# Patient Record
Sex: Female | Born: 2000 | Race: Black or African American | Hispanic: No | Marital: Single | Smoking: Never smoker
Health system: Southern US, Community
[De-identification: ages and names within clinical notes are randomized; demographics above are authoritative.]

## PROBLEM LIST (undated history)

## (undated) DIAGNOSIS — F209 Schizophrenia, unspecified: Secondary | ICD-10-CM

## (undated) DIAGNOSIS — Z789 Other specified health status: Secondary | ICD-10-CM

## (undated) DIAGNOSIS — F32A Depression, unspecified: Secondary | ICD-10-CM

## (undated) DIAGNOSIS — F419 Anxiety disorder, unspecified: Secondary | ICD-10-CM

## (undated) DIAGNOSIS — D649 Anemia, unspecified: Secondary | ICD-10-CM

## (undated) HISTORY — PX: NO PAST SURGERIES: SHX2092

---
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
## 2021-05-15 ENCOUNTER — Telehealth: Payer: Self-pay

## 2021-05-15 ENCOUNTER — Other Ambulatory Visit: Payer: Self-pay

## 2021-05-15 ENCOUNTER — Encounter: Payer: Self-pay | Admitting: Obstetrics & Gynecology

## 2021-05-15 ENCOUNTER — Ambulatory Visit (INDEPENDENT_AMBULATORY_CARE_PROVIDER_SITE_OTHER): Payer: Medicaid Other | Admitting: Obstetrics & Gynecology

## 2021-05-15 VITALS — BP 106/70

## 2021-05-15 DIAGNOSIS — Z818 Family history of other mental and behavioral disorders: Secondary | ICD-10-CM

## 2021-05-15 DIAGNOSIS — Z01812 Encounter for preprocedural laboratory examination: Secondary | ICD-10-CM

## 2021-05-15 DIAGNOSIS — Z30015 Encounter for initial prescription of vaginal ring hormonal contraceptive: Secondary | ICD-10-CM

## 2021-05-15 DIAGNOSIS — Z3046 Encounter for surveillance of implantable subdermal contraceptive: Secondary | ICD-10-CM

## 2021-05-15 LAB — PREGNANCY, URINE: Preg Test, Ur: NEGATIVE

## 2021-05-15 MED ORDER — ETONOGESTREL-ETHINYL ESTRADIOL 0.12-0.015 MG/24HR VA RING: 1.0000 | VAGINAL_RING | VAGINAL | 4 refills | Status: DC

## 2021-05-15 NOTE — Progress Notes (Signed)
? ? ?Teresa Blackwell [birth year] [id number] ? ? ?     21 y.o.  G1P1L1 Single.  Son is 2+ yo. ? ?RP: Nexplanon removal and contraception counseling ? ?HPI: Nexplanon inserted in 11/2017, overdue for removal.  Last unprotected IC was 5 days ago.  No pelvic pain.  Mother recently Dxed with Bipolar disorder.  Patient feel she has lows and highs, would like to be evaluated.  Feeling good today.  No suicidal ideation. ? ? ? ?OB History  ?Gravida Para Term Preterm AB Living  ?1 0 0 0 0 1  ?SAB IAB Ectopic Multiple Live Births  ?0 0 0 0 0  ?  ?# Outcome Date GA Lbr Len/2nd Weight Sex Delivery Anes PTL Lv  ?1 Gravida           ? ? ?Past medical history,surgical history, problem list, medications, allergies, family history and social history were all reviewed and documented in the EPIC chart. ? ? ?Directed ROS with pertinent positives and negatives documented in the history of present illness/assessment and plan. ? ?Exam: ? ?Vitals:  ? 05/15/21 1146  ?BP: 106/70  ? ?General appearance:  Normal ? ?                                                           Nexplanon procedure note (removal) ? ?The patient presented to the office today requesting for removal of her Nexplanon that was placed in the year 9/.2019 on her left arm.   On examination the nexplanon implant was palpated and the distal end  (end  closest to the elbow) was marked. The area was sterilized with Betadine solution. 1% lidocaine was used for local anesthesia and approximately 1 cc  was injected into the site that was marked where the incision was to be made. The local anesthetic was injected under the implant in an effort to keep it  close to the skin surface. Slight pressure pushing downward was made at the proximal end  of the implant in an effort to stabilize it. A bulge appeared indicating the distal end of the implant. A small transverse incision of 2 mm was made at that location. By gently pushing the implant toward the incision, the tip became visible.  Grasping the implant with a curved forcep facilitated in gently removing the implant. Full confirmation of the entire implant which is 4 cm long was inspected and was intact and was shown to the patient and discarded. After removing the implant, the incision was closed with 3Steri-Strips, a band-aid and a bandage. Patient will be instructed to remove the pressure bandage in 24 hours, the band-aid in 3 days and the Steri-Strips in 7 days. ? ?UPT Neg ? ?                                            ?Assessment/Plan:  21 y.o. G1P0001  ? ?1. Encounter for Nexplanon removal ?Nexplanon inserted in 11/2017, overdue for removal.  Last unprotected IC was 5 days ago.  No pelvic pain.  Easy removal of Nexplanon.  No complication.  Post procedure precautions discussed. ? ?2. Encounter for initial prescription of vaginal ring hormonal contraceptive ?Counseling on contraception done.  Decides to use Nuvaring.  Usage, risks and benefits reviewed.  No CI.  Prescription sent to pharmacy.  Will do a HPT at 14 days from last unprotected IC before starting.  Use condoms during the first month.   ? ?3. Family history of bipolar disorder ?Mother recently Dxed with Bipolar disorder.  Patient concerned that she might have that disorder as well.  No suicidal ideation.  Well currently.  Refer to psychiatrist for evaluation and management. ? ?Counseling on above issues >20 minutes. ? ?Other orders ?- IBUPROFEN PO; Take by mouth. ?- Acetaminophen (TYLENOL PO); Take by mouth. ?- Acetaminophen (MIDOL PO); Take by mouth. ?- diphenhydrAMINE HCl (BENADRYL PO); Take by mouth. ?- etonogestrel-ethinyl estradiol (NUVARING) 0.12-0.015 MG/24HR vaginal ring; Place 1 each vaginally every 28 (twenty-eight) days. Insert vaginally and leave in place for 4 consecutive weeks, then switch rings.  ? ?Genia Del MD, 11:53 AM 05/15/2021 ? ? ? ?  ?

## 2021-05-15 NOTE — Addendum Note (Signed)
Addended by: Nelva Nay on: 05/15/2021 12:41 PM ? ? Modules accepted: Orders ? ?

## 2021-05-15 NOTE — Telephone Encounter (Signed)
Teresa Bruins, MD  [facility] Triage ?Mother recently diagnosed with Bipolar disorder.  Patient feels that she may have the disorder as well.  ? ? ?Referral sent to [facility].  ? ? ?

## 2021-06-03 NOTE — Addendum Note (Signed)
Addended by: Jodelle Red D on: 06/03/2021 04:46 PM ? ? Modules accepted: Orders ? ?

## 2021-06-03 NOTE — Telephone Encounter (Signed)
Followed up with [facility] in regards to referral. They stated that they did not accept medicaid.  ? ?Will fax referral to [facility]. ? ?They stated that they will call pt or pt can call them.  ? ? ?

## 2021-06-09 NOTE — Telephone Encounter (Signed)
Per [facility]- the number provided is not in service. ? ?I tried to call pt # and got the same msg "the call cannot be completed as dialed."  ?

## 2021-06-09 NOTE — Telephone Encounter (Signed)
Deanna Artis from [facility] states she will call pt today to set her up for an appt.  ?

## 2021-06-11 NOTE — Telephone Encounter (Signed)
FYI. Multiple attempt to contact pt re: referral to psych. Number in chart is not in service.  ?

## 2021-06-15 ENCOUNTER — Ambulatory Visit: Payer: Medicaid Other | Admitting: Obstetrics & Gynecology

## 2021-07-14 ENCOUNTER — Ambulatory Visit (INDEPENDENT_AMBULATORY_CARE_PROVIDER_SITE_OTHER): Payer: Medicaid Other | Admitting: Obstetrics & Gynecology

## 2021-07-14 ENCOUNTER — Encounter: Payer: Self-pay | Admitting: Obstetrics & Gynecology

## 2021-07-14 VITALS — BP 110/64 | HR 68 | Resp 16 | Ht 61.5 in | Wt 107.0 lb

## 2021-07-14 DIAGNOSIS — Z01419 Encounter for gynecological examination (general) (routine) without abnormal findings: Secondary | ICD-10-CM | POA: Diagnosis not present

## 2021-07-14 DIAGNOSIS — Z30011 Encounter for initial prescription of contraceptive pills: Secondary | ICD-10-CM | POA: Diagnosis not present

## 2021-07-14 MED ORDER — NORETHIN ACE-ETH ESTRAD-FE 1-20 MG-MCG(24) PO TABS: 1.0000 | ORAL_TABLET | Freq: Every day | ORAL | 4 refills | Status: DC

## 2021-07-14 NOTE — Progress Notes (Signed)
? ? ?Teresa Blackwell [birth year] [id number] ? ? ?History:    21 y.o.  G1P1L1 FOB involved.  SVD 08/2018.  Son Maureen Ralphs is almost 80 yo.  ?  ?RP:  Established patient presenting for annual gyn exam  ?  ?HPI: Nexplanon removed on 05/15/21 and started on Nuvaring.  Pelvic cramping on Nuvaring, so stopped it mid April. No BTB.  Using condoms since then. Would like to try BCPs.  No pelvic pain. No pain with IC. Urine/BMs normal.  Breasts normal.  BMI 19.89.   ? ? ?Past medical history,surgical history, family history and social history were all reviewed and documented in the EPIC chart. ? ?Gynecologic History ?Patient's last menstrual period was 06/23/2021 (exact date). ? ?Obstetric History ?OB History  ?Gravida Para Term Preterm AB Living  ?1 0 0 0 0 1  ?SAB IAB Ectopic Multiple Live Births  ?0 0 0 0 0  ?  ?# Outcome Date GA Lbr Len/2nd Weight Sex Delivery Anes PTL Lv  ?1 Gravida           ? ? ? ?ROS: A ROS was performed and pertinent positives and negatives are included in the history. ?GENERAL: No fevers or chills. HEENT: No change in vision, no earache, sore throat or sinus congestion. NECK: No pain or stiffness. CARDIOVASCULAR: No chest pain or pressure. No palpitations. PULMONARY: No shortness of breath, cough or wheeze. GASTROINTESTINAL: No abdominal pain, nausea, vomiting or diarrhea, melena or bright red blood per rectum. GENITOURINARY: No urinary frequency, urgency, hesitancy or dysuria. MUSCULOSKELETAL: No joint or muscle pain, no back pain, no recent trauma. DERMATOLOGIC: No rash, no itching, no lesions. ENDOCRINE: No polyuria, polydipsia, no heat or cold intolerance. No recent change in weight. HEMATOLOGICAL: No anemia or easy bruising or bleeding. NEUROLOGIC: No headache, seizures, numbness, tingling or weakness. PSYCHIATRIC: No depression, no loss of interest in normal activity or change in sleep pattern.  ?  ? ?Exam: ? ? ?BP 110/64   Pulse 68   Resp 16   Ht 5' 1.5" (1.562 m)   Wt 107 lb (48.5 kg)   LMP  06/23/2021 (Exact Date)   BMI 19.89 kg/m?  ? ?Body mass index is 19.89 kg/m?. ? ?General appearance : Well developed well nourished female. No acute distress ?HEENT: Eyes: no retinal hemorrhage or exudates,  Neck supple, trachea midline, no carotid bruits, no thyroidmegaly ?Lungs: Clear to auscultation, no rhonchi or wheezes, or rib retractions  ?Heart: Regular rate and rhythm, no murmurs or gallops ?Abdomen: no palpable masses or tenderness, no rebound or guarding ?Extremities: no edema or skin discoloration or tenderness ? ?Pelvic: Vulva: Normal ?            Vagina: No gross lesions or discharge ? Cervix: No gross lesions or discharge ? Uterus  AV, normal size, shape and consistency, non-tender and mobile ? Adnexa  Without masses or tenderness ? Anus: Normal ? ? ?Assessment/Plan:  21 y.o. female for annual exam  ? ?1. Well female exam with routine gynecological exam ? Nexplanon removed on 05/15/21 and started on Nuvaring.  Pelvic cramping on Nuvaring, so stopped it mid April. No BTB.  Using condoms since then. Would like to try BCPs.  No pelvic pain. No pain with IC. Urine/BMs normal.  Breasts normal.  BMI 19.89.   ? ?2. Encounter for initial prescription of contraceptive pills ?Side effect with pelvic discomfort on Nuvaring.  Will start on the generic of LoEstrin 24 FE 1/20 with next period.  Risks/benefits/usage thoroughly reviewed.  Prescription sent to pharmacy. ? ?Other orders ?- NAPROXEN SODIUM PO; Take by mouth. ?- Norethindrone Acetate-Ethinyl Estrad-FE (LOESTRIN 24 FE) 1-20 MG-MCG(24) tablet; Take 1 tablet by mouth daily.  ? ?Genia Del MD, 3:04 PM 07/14/2021 ? ?  ?

## 2021-11-18 LAB — HEPATITIS C ANTIBODY: HCV Ab: NEGATIVE

## 2021-11-24 LAB — OB RESULTS CONSOLE HEPATITIS B SURFACE ANTIGEN: Hepatitis B Surface Ag: NEGATIVE

## 2021-11-24 LAB — OB RESULTS CONSOLE HIV ANTIBODY (ROUTINE TESTING): HIV: NONREACTIVE

## 2021-11-27 LAB — OB RESULTS CONSOLE GC/CHLAMYDIA
Chlamydia: NEGATIVE
Neisseria Gonorrhea: NEGATIVE

## 2021-11-27 LAB — OB RESULTS CONSOLE RPR: RPR: NONREACTIVE

## 2021-11-27 LAB — OB RESULTS CONSOLE RUBELLA ANTIBODY, IGM: Rubella: IMMUNE

## 2021-12-04 ENCOUNTER — Encounter (HOSPITAL_COMMUNITY): Payer: Self-pay | Admitting: *Deleted

## 2021-12-04 ENCOUNTER — Inpatient Hospital Stay (HOSPITAL_COMMUNITY)
Admission: AD | Admit: 2021-12-04 | Discharge: 2021-12-05 | Disposition: A | Discharge status: Home / Self Care | Payer: Medicaid Other | Attending: Obstetrics and Gynecology | Admitting: Obstetrics and Gynecology

## 2021-12-04 DIAGNOSIS — O2340 Unspecified infection of urinary tract in pregnancy, unspecified trimester: Secondary | ICD-10-CM

## 2021-12-04 DIAGNOSIS — Z792 Long term (current) use of antibiotics: Secondary | ICD-10-CM | POA: Insufficient documentation

## 2021-12-04 DIAGNOSIS — O99712 Diseases of the skin and subcutaneous tissue complicating pregnancy, second trimester: Secondary | ICD-10-CM | POA: Insufficient documentation

## 2021-12-04 DIAGNOSIS — N390 Urinary tract infection, site not specified: Secondary | ICD-10-CM | POA: Insufficient documentation

## 2021-12-04 DIAGNOSIS — O26899 Other specified pregnancy related conditions, unspecified trimester: Secondary | ICD-10-CM

## 2021-12-04 DIAGNOSIS — L0591 Pilonidal cyst without abscess: Secondary | ICD-10-CM

## 2021-12-04 DIAGNOSIS — O2342 Unspecified infection of urinary tract in pregnancy, second trimester: Secondary | ICD-10-CM | POA: Insufficient documentation

## 2021-12-04 DIAGNOSIS — R109 Unspecified abdominal pain: Secondary | ICD-10-CM | POA: Insufficient documentation

## 2021-12-04 DIAGNOSIS — O26892 Other specified pregnancy related conditions, second trimester: Secondary | ICD-10-CM | POA: Insufficient documentation

## 2021-12-04 DIAGNOSIS — Z3A17 17 weeks gestation of pregnancy: Secondary | ICD-10-CM

## 2021-12-04 NOTE — MAU Note (Signed)
Pt says she has a boil Noticed at 0600 - On her lower back - upper buttocks  Also has cramps  lower abd and vag - having   cramps all day

## 2021-12-05 DIAGNOSIS — O2342 Unspecified infection of urinary tract in pregnancy, second trimester: Secondary | ICD-10-CM | POA: Diagnosis not present

## 2021-12-05 DIAGNOSIS — R109 Unspecified abdominal pain: Secondary | ICD-10-CM

## 2021-12-05 DIAGNOSIS — L0591 Pilonidal cyst without abscess: Secondary | ICD-10-CM | POA: Diagnosis not present

## 2021-12-05 DIAGNOSIS — N390 Urinary tract infection, site not specified: Secondary | ICD-10-CM | POA: Diagnosis not present

## 2021-12-05 DIAGNOSIS — Z792 Long term (current) use of antibiotics: Secondary | ICD-10-CM | POA: Diagnosis not present

## 2021-12-05 DIAGNOSIS — Z3A17 17 weeks gestation of pregnancy: Secondary | ICD-10-CM

## 2021-12-05 DIAGNOSIS — O99712 Diseases of the skin and subcutaneous tissue complicating pregnancy, second trimester: Secondary | ICD-10-CM | POA: Diagnosis not present

## 2021-12-05 DIAGNOSIS — O26892 Other specified pregnancy related conditions, second trimester: Secondary | ICD-10-CM | POA: Diagnosis not present

## 2021-12-05 LAB — URINALYSIS, ROUTINE W REFLEX MICROSCOPIC
Bilirubin Urine: NEGATIVE
Glucose, UA: NEGATIVE mg/dL
Hgb urine dipstick: NEGATIVE
Ketones, ur: NEGATIVE mg/dL
Nitrite: POSITIVE — AB
Protein, ur: NEGATIVE mg/dL
Specific Gravity, Urine: 1.023 (ref 1.005–1.030)
pH: 6 (ref 5.0–8.0)

## 2021-12-05 MED ORDER — CEFADROXIL 500 MG PO CAPS: 500.0000 mg | ORAL_CAPSULE | Freq: Two times a day (BID) | ORAL | 0 refills | Status: AC

## 2021-12-05 NOTE — MAU Provider Note (Signed)
Chief Complaint: Abdominal Pain   None     SUBJECTIVE HPI: Teresa Blackwell is a 21 y.o. G2P0001 at [redacted]w[redacted]d who presents to maternity admissions reporting dx of UTI in the office 2 days ago but her medicines have not been at the pharmacy. Today she started having sharp intermittent abdominal pain, like something  stabbing, and noticed a new cyst in her lower back/upper buttocks area.    HPI  Past Medical History:  Diagnosis Date   Medical history non-contributory    Past Surgical History:  Procedure Laterality Date   Nexplanon insertion  11/21/2017   NO PAST SURGERIES     Social History   Socioeconomic History   Marital status: Single    Spouse name: Not on file   Number of children: Not on file   Years of education: Not on file   Highest education level: Not on file  Occupational History   Not on file  Tobacco Use   Smoking status: Never   Smokeless tobacco: Current   Tobacco comments:    vape  Vaping Use   Vaping Use: Never used  Substance and Sexual Activity   Alcohol use: Not Currently   Drug use: No   Sexual activity: Yes    Partners: Male    Birth control/protection: Condom  Other Topics Concern   Not on file  Social History Narrative   Not on file   Social Determinants of Health   Financial Resource Strain: Not on file  Food Insecurity: Not on file  Transportation Needs: Not on file  Physical Activity: Not on file  Stress: Not on file  Social Connections: Not on file  Intimate Partner Violence: Not on file   No current facility-administered medications on file prior to encounter.   Current Outpatient Medications on File Prior to Encounter  Medication Sig Dispense Refill   Acetaminophen (TYLENOL PO) Take by mouth.     diphenhydrAMINE HCl (BENADRYL PO) Take by mouth.     No Known Allergies  ROS:  Review of Systems  Constitutional:  Negative for chills, fatigue and fever.  Respiratory:  Negative for shortness of breath.   Cardiovascular:   Negative for chest pain.  Gastrointestinal:  Positive for abdominal pain. Negative for nausea and vomiting.  Genitourinary:  Negative for difficulty urinating, dysuria, flank pain, pelvic pain, vaginal bleeding, vaginal discharge and vaginal pain.  Musculoskeletal:  Positive for back pain.  Skin:        Painful bump in buttocks/low back area  Neurological:  Negative for dizziness and headaches.  Psychiatric/Behavioral: Negative.       I have reviewed patient's Past Medical Hx, Surgical Hx, Family Hx, Social Hx, medications and allergies.   Physical Exam  Patient Vitals for the past 24 hrs:  BP Temp Pulse Resp Height Weight  12/05/21 0215 -- -- -- 20 -- --  12/04/21 2350 (!) 105/49 98 F (36.7 C) 82 20 5\' 2"  (1.575 m) 63.5 kg   Constitutional: Well-developed, well-nourished female in no acute distress.  Cardiovascular: normal rate Respiratory: normal effort GI: Abd soft, non-tender. Pos BS x 4 MS: Extremities nontender, no edema, normal ROM Neurologic: Alert and oriented x 4.  GU: Neg CVAT. Integumentary: No visible edema or erythema at natal cleft but palpable 1.5 cm smooth round mass with significant tenderness noted. No pain palpated 1-2 cm above or below mass.   PELVIC EXAM:  Dilation: Closed Effacement (%): Thick Exam by:: Fatima Blank, CNM   FHT 150 by doppler  LAB  RESULTS Results for orders placed or performed during the hospital encounter of 12/04/21 (from the past 24 hour(s))  Urinalysis, Routine w reflex microscopic Urine, Clean Catch     Status: Abnormal   Collection Time: 12/05/21 12:44 AM  Result Value Ref Range   Color, Urine YELLOW YELLOW   APPearance HAZY (A) CLEAR   Specific Gravity, Urine 1.023 1.005 - 1.030   pH 6.0 5.0 - 8.0   Glucose, UA NEGATIVE NEGATIVE mg/dL   Hgb urine dipstick NEGATIVE NEGATIVE   Bilirubin Urine NEGATIVE NEGATIVE   Ketones, ur NEGATIVE NEGATIVE mg/dL   Protein, ur NEGATIVE NEGATIVE mg/dL   Nitrite POSITIVE (A) NEGATIVE    Leukocytes,Ua SMALL (A) NEGATIVE   RBC / HPF 0-5 0 - 5 RBC/hpf   WBC, UA 6-10 0 - 5 WBC/hpf   Bacteria, UA MANY (A) NONE SEEN   Squamous Epithelial / LPF 0-5 0 - 5   Mucus PRESENT    Ca Oxalate Crys, UA PRESENT        IMAGING No results found.  MAU Management/MDM: Orders Placed This Encounter  Procedures   Culture, OB Urine   Urinalysis, Routine w reflex microscopic Urine, Clean Catch   Discharge patient    Meds ordered this encounter  Medications   cefadroxil (DURICEF) 500 MG capsule    Sig: Take 1 capsule (500 mg total) by mouth 2 (two) times daily for 7 days.    Dispense:  14 capsule    Refill:  0    Order Specific Question:   Supervising Provider    Answer:   Elonda Husky, LUTHER H [[id number]]    No evidence of preterm labor with closed cervix.  Pt with known UTI, unable to pick up medications and UA in MAU tonight confirms UTI with positive nitrites.  No systemic symptoms or CVA tenderness to indicate pyelonephritis.  Rx sent to 24 hour pharmacy for Duricef BID for 7 days. Pt to use warm compresses, ice for comfort, Tylenol for likely pilonidal cyst.  F/U in office as scheduled. Return to MAU as needed for emergencies.    ASSESSMENT 1. Urinary tract infection in mother during pregnancy, antepartum   2. [redacted] weeks gestation of pregnancy   3. Abdominal pain affecting pregnancy   4. Pilonidal cyst of natal cleft     PLAN Discharge home Allergies as of 12/05/2021   No Known Allergies      Medication List     STOP taking these medications    IBUPROFEN PO   NAPROXEN SODIUM PO   Norethindrone Acetate-Ethinyl Estrad-FE 1-20 MG-MCG(24) tablet Commonly known as: LOESTRIN 24 FE       TAKE these medications    BENADRYL PO Take by mouth.   cefadroxil 500 MG capsule Commonly known as: DURICEF Take 1 capsule (500 mg total) by mouth 2 (two) times daily for 7 days.   TYLENOL PO Take by mouth. What changed: Another medication with the same name was removed. Continue  taking this medication, and follow the directions you see here.        Follow-up [facility] Follow up.   Specialty: Obstetrics and Gynecology Why: As scheduled Contact information: [address] [place] [address] [phone]        [facility] Follow up.   Specialty: Obstetrics and Gynecology Why: As needed for emergencies Contact information: [address] [id number] [place] [phone]  Fatima Blank Certified Nurse-Midwife 12/05/2021  4:20 AM

## 2021-12-08 LAB — CULTURE, OB URINE: Culture: >=100000 — AB

## 2022-02-24 ENCOUNTER — Other Ambulatory Visit: Payer: Self-pay

## 2022-02-24 ENCOUNTER — Encounter (HOSPITAL_COMMUNITY): Payer: Self-pay | Admitting: Obstetrics & Gynecology

## 2022-02-24 ENCOUNTER — Inpatient Hospital Stay (HOSPITAL_COMMUNITY)
Admission: AD | Admit: 2022-02-24 | Discharge: 2022-02-24 | Disposition: A | Discharge status: Home / Self Care | Payer: Medicaid Other | Attending: Obstetrics & Gynecology | Admitting: Obstetrics & Gynecology

## 2022-02-24 DIAGNOSIS — R102 Pelvic and perineal pain: Secondary | ICD-10-CM | POA: Diagnosis present

## 2022-02-24 DIAGNOSIS — Z3A28 28 weeks gestation of pregnancy: Secondary | ICD-10-CM | POA: Diagnosis not present

## 2022-02-24 DIAGNOSIS — N949 Unspecified condition associated with female genital organs and menstrual cycle: Secondary | ICD-10-CM

## 2022-02-24 DIAGNOSIS — O26893 Other specified pregnancy related conditions, third trimester: Secondary | ICD-10-CM | POA: Diagnosis not present

## 2022-02-24 HISTORY — DX: Schizophrenia, unspecified: F20.9

## 2022-02-24 HISTORY — DX: Depression, unspecified: F32.A

## 2022-02-24 LAB — URINALYSIS, ROUTINE W REFLEX MICROSCOPIC
Bacteria, UA: NONE SEEN
Bilirubin Urine: NEGATIVE
Glucose, UA: NEGATIVE mg/dL
Hgb urine dipstick: NEGATIVE
Ketones, ur: 80 mg/dL — AB
Leukocytes,Ua: NEGATIVE
Nitrite: NEGATIVE
Protein, ur: 30 mg/dL — AB
Specific Gravity, Urine: 1.024 (ref 1.005–1.030)
pH: 6 (ref 5.0–8.0)

## 2022-02-24 MED ORDER — CYCLOBENZAPRINE HCL 5 MG PO TABS
10.0000 mg | ORAL_TABLET | Freq: Once | ORAL | Status: AC
  Administered 2022-02-24: 10 mg via ORAL
  Filled 2022-02-24: qty 2

## 2022-02-24 MED ORDER — ONDANSETRON 4 MG PO TBDP
8.0000 mg | ORAL_TABLET | Freq: Once | ORAL | Status: AC
  Administered 2022-02-24: 8 mg via ORAL
  Filled 2022-02-24: qty 2

## 2022-02-24 NOTE — MAU Provider Note (Signed)
History     CSN: [id number]  Arrival date and time: 02/24/22 1626   Event Date/Time   First Provider Initiated Contact with Patient 02/24/22 1733      Chief Complaint  Patient presents with   Pelvic Pain   HPI  Ms. Teresa Blackwell is a 21 y.o. year old G1P1001 female at [redacted]w[redacted]d weeks gestation who presents to MAU reporting sharp, lower abdomen pain that started today. She reports this pain has happened before, but always goes away. Today the pain didn't go away as usual. The pain is worsened with walking and "when I start to get stressed out." She states, "I have been very distracted and stressed today." She denies any VB or LOF. She is currently feeling nausea; "only ate breakfast this morning." She receives [facility] with [facility]; next appt is 02/25/2022.   OB History     Gravida  2   Para  1   Term  1   Preterm  0   AB  0   Living  1      SAB  0   IAB  0   Ectopic  0   Multiple  0   Live Births  1           Past Medical History:  Diagnosis Date   Depression    Medical history non-contributory    Schizophrenia (HCC)     Past Surgical History:  Procedure Laterality Date   Nexplanon insertion  11/21/2017   NO PAST SURGERIES      Family History  Problem Relation Age of Onset   Breast cancer Mother    Schizophrenia Father    Diabetes Maternal Grandmother    Hypertension Maternal Grandmother    Cancer Maternal Grandmother    HIV/AIDS Maternal Grandfather    Hypertension Paternal Grandfather     Social History   Tobacco Use   Smoking status: Never   Smokeless tobacco: Current   Tobacco comments:    vape  Vaping Use   Vaping Use: Never used  Substance Use Topics   Alcohol use: Not Currently   Drug use: No    Allergies: No Known Allergies  Medications Prior to Admission  Medication Sig Dispense Refill Last Dose   Acetaminophen (TYLENOL PO) Take by mouth.   02/23/2022   Prenatal Vit-Fe Fumarate-FA (PRENATAL MULTIVITAMIN)  TABS tablet Take 1 tablet by mouth daily at 12 noon.   02/24/2022   terconazole (TERAZOL 7) 0.4 % vaginal cream Place 1 applicator vaginally at bedtime.   02/23/2022   diphenhydrAMINE HCl (BENADRYL PO) Take by mouth.   More than a month    Review of Systems  Constitutional: Negative.   HENT: Negative.    Eyes: Negative.   Respiratory: Negative.    Cardiovascular: Negative.   Gastrointestinal: Negative.   Endocrine: Negative.   Genitourinary:  Positive for pelvic pain (increases with walking).  Musculoskeletal: Negative.   Skin: Negative.   Allergic/Immunologic: Negative.   Neurological: Negative.   Hematological: Negative.   Psychiatric/Behavioral: Negative.     Physical Exam   Blood pressure (!) 123/48, pulse 76, temperature 98.5 F (36.9 C), temperature source Oral, resp. rate 16, height 5\' 2"  (1.575 m), weight 63.3 kg, last menstrual period 06/23/2021, SpO2 99 %, currently breastfeeding.  Physical Exam Vitals and nursing note reviewed.  Constitutional:      Appearance: Normal appearance. She is normal weight.  Cardiovascular:     Rate and Rhythm: Normal rate.  Pulmonary:  Effort: Pulmonary effort is normal.  Abdominal:     Palpations: Abdomen is soft.  Genitourinary:    Comments: deferred Skin:    General: Skin is warm and dry.  Neurological:     Mental Status: She is alert and oriented to person, place, and time.  Psychiatric:        Mood and Affect: Mood normal.        Behavior: Behavior normal.        Thought Content: Thought content normal.        Judgment: Judgment normal.    REACTIVE NST - FHR: 130 bpm / moderate variability / accels present / decels absent / TOCO: UI noted, irreg UC's  MAU Course  Procedures  MDM CCUA Flexeril 10 mg po -- pain improving Zofran 8 mg ODT -- resolved N/V  Results for orders placed or performed during the hospital encounter of 02/24/22 (from the past 24 hour(s))  Urinalysis, Routine w reflex microscopic Urine,  Clean Catch     Status: Abnormal   Collection Time: 02/24/22  5:01 PM  Result Value Ref Range   Color, Urine YELLOW YELLOW   APPearance HAZY (A) CLEAR   Specific Gravity, Urine 1.024 1.005 - 1.030   pH 6.0 5.0 - 8.0   Glucose, UA NEGATIVE NEGATIVE mg/dL   Hgb urine dipstick NEGATIVE NEGATIVE   Bilirubin Urine NEGATIVE NEGATIVE   Ketones, ur 80 (A) NEGATIVE mg/dL   Protein, ur 30 (A) NEGATIVE mg/dL   Nitrite NEGATIVE NEGATIVE   Leukocytes,Ua NEGATIVE NEGATIVE   RBC / HPF 0-5 0 - 5 RBC/hpf   WBC, UA 0-5 0 - 5 WBC/hpf   Bacteria, UA NONE SEEN NONE SEEN   Squamous Epithelial / LPF 0-5 0 - 5   Mucus PRESENT     Assessment and Plan  1. Round ligament pain - Information provided on RLP - Advised to take Tylenol 1000 mg po prn pain   2. [redacted] weeks gestation of pregnancy   - Discharge home - Keep scheduled appt with [facility] 02/25/2022 - Patient verbalized an understanding of the plan of care and agrees.    Raelyn Mora, CNM 02/24/2022, 5:33 PM

## 2022-02-24 NOTE — MAU Note (Signed)
Teresa Blackwell is a 21 y.o. at [redacted]w[redacted]d here in MAU reporting: sharp lower abdominal pain that started today. Pt reports she has had this pain before and it has gone away, today it has not gone away. Pain is intermittent and occurs mostly with walking. No bleeding or LOF. Pt reports she is currently being treated for yeast infection through her OB office and she is seeing some discharge from the suppositories.   Pt also reporting hx of depression and schizophrenia. Pt not currently on meds. While asking safety questions pt does report that she has had suicidal ideation within the past month but denies plan or intention.   Onset of complaint: today  Pain score: 7/10  Vitals:   02/24/22 1713  BP: (!) 123/48  Pulse: 76  Resp: 16  Temp: 98.5 F (36.9 C)  SpO2: 99%     FHT:+FM, EFM applied in room  Lab orders placed from triage: UA

## 2022-03-08 NOTE — L&D Delivery Note (Signed)
NVD NOTE Date:    Delivering Physician:  Bing Matter DO Anesthesia:   1.  Laboring epidural  Pre-Delivery Diagnosis:   1.  22 y.o. G2P1001 [redacted]w[redacted]d 2.  SROM   Post-Delivery Diagnosis:   Same  Delivery of liveborn female neonate  Procedure:  Spontaneous vaginal delivery  QBL: 1123456mL  Complications:  None  LABOR AND DELIVERY SUMMARY The patient is a 89yoG2P1001 who presented at 40 0/[redacted] weeks gestation with SROM and clear fluid at 0200. She was admitted, placed on continuous EFM and toco.  Her labor course was augmented with pitocin. She received an epidural for pain control.   The patient progressed to completely effaced and dilated at 1403.  Patient labored down until she had an overwhelming urge to push and pushing began.  A liveborn female neonate was delivered via spontaneous vaginal delivery over an intact perineum from the LOA position at 1435.  There was no nuchal cord.  Spontaneous cry was noted.  No shoulder dystocia was encountered.  Infant was placed on the maternal abdomen immediately following delivery.  Oropharynx and nasopharynx were bulb suctioned immediately following delivery.  Cord was doubly clamped and cut after delayed cord clamping.  Cord blood obtained. Pitocin was added to the patient's IV. The placenta delivered spontaneously and apparently intact with a 3 vessel cord at 1440 and discarded.  Uterus was noted to be firm.  No lacerations were identified.  All sponge, needle and instrument counts were correct at the end of the procedure.  The patient and baby remained in the LDR in stable condition. Dr. OIvin Poot was present for the entire delivery.  Baby Name: DRhetta Mura   Delivery Summary for MFonnie Mu Labor Events:   Preterm labor: No data found  Rupture date: 05/15/2022  Rupture time: 2:45 AM  Rupture type: Spontaneous Intact  Fluid Color: Clear  Induction: No data found  Augmentation: No data found  Complications: No data found  Cervical  ripening: No data found No data found   No data found     Delivery:   Episiotomy: No data found  Lacerations: No data found  Repair suture: No data found  Repair # of packets: No data found  Blood loss (ml): 105   Information for the patient's newborn:  AEsm…, Flammer[[id number]  Delivery 05/15/2022 2:35 PM by   Sex:  female Gestational Age: 4641w0delivery Clinician:   Living?:         APGARS  One minute Five minutes Ten minutes  Skin color:        Heart rate:        Grimace:        Muscle tone:        Breathing:        Totals: 8  9      Presentation/position:      Resuscitation:   Cord information:    Disposition of cord blood:     Blood gases sent?  Complications:   Placenta: Delivered:       appearance Newborn Measurements: Weight:    Height:    Head circumference:    Chest circumference:    Other providers:    Additional  information: Forceps:   Vacuum:   Breech:   Observed anomalies        Dr. EbBing Matter

## 2022-04-02 ENCOUNTER — Inpatient Hospital Stay (HOSPITAL_COMMUNITY)
Admission: AD | Admit: 2022-04-02 | Discharge: 2022-04-02 | Disposition: A | Discharge status: Home / Self Care | Payer: Medicaid Other | Attending: Obstetrics & Gynecology | Admitting: Obstetrics & Gynecology

## 2022-04-02 ENCOUNTER — Encounter (HOSPITAL_COMMUNITY): Payer: Self-pay | Admitting: Obstetrics & Gynecology

## 2022-04-02 DIAGNOSIS — O99343 Other mental disorders complicating pregnancy, third trimester: Secondary | ICD-10-CM | POA: Diagnosis not present

## 2022-04-02 DIAGNOSIS — F209 Schizophrenia, unspecified: Secondary | ICD-10-CM | POA: Insufficient documentation

## 2022-04-02 DIAGNOSIS — O99333 Smoking (tobacco) complicating pregnancy, third trimester: Secondary | ICD-10-CM | POA: Insufficient documentation

## 2022-04-02 DIAGNOSIS — W19XXXA Unspecified fall, initial encounter: Secondary | ICD-10-CM | POA: Diagnosis not present

## 2022-04-02 DIAGNOSIS — Z3689 Encounter for other specified antenatal screening: Secondary | ICD-10-CM

## 2022-04-02 DIAGNOSIS — R102 Pelvic and perineal pain: Secondary | ICD-10-CM | POA: Insufficient documentation

## 2022-04-02 DIAGNOSIS — R109 Unspecified abdominal pain: Secondary | ICD-10-CM | POA: Insufficient documentation

## 2022-04-02 DIAGNOSIS — F17290 Nicotine dependence, other tobacco product, uncomplicated: Secondary | ICD-10-CM | POA: Insufficient documentation

## 2022-04-02 DIAGNOSIS — O26893 Other specified pregnancy related conditions, third trimester: Secondary | ICD-10-CM | POA: Insufficient documentation

## 2022-04-02 DIAGNOSIS — Z3A33 33 weeks gestation of pregnancy: Secondary | ICD-10-CM | POA: Insufficient documentation

## 2022-04-02 DIAGNOSIS — M79604 Pain in right leg: Secondary | ICD-10-CM | POA: Diagnosis not present

## 2022-04-02 HISTORY — DX: Anemia, unspecified: D64.9

## 2022-04-02 HISTORY — DX: Anxiety disorder, unspecified: F41.9

## 2022-04-02 LAB — URINALYSIS, ROUTINE W REFLEX MICROSCOPIC
Bilirubin Urine: NEGATIVE
Glucose, UA: NEGATIVE mg/dL
Hgb urine dipstick: NEGATIVE
Ketones, ur: 80 mg/dL — AB
Nitrite: NEGATIVE
Protein, ur: 30 mg/dL — AB
Specific Gravity, Urine: 1.019 (ref 1.005–1.030)
pH: 7 (ref 5.0–8.0)

## 2022-04-02 MED ORDER — ACETAMINOPHEN 500 MG PO TABS
1000.0000 mg | ORAL_TABLET | Freq: Once | ORAL | Status: AC
  Administered 2022-04-02: 1000 mg via ORAL
  Filled 2022-04-02: qty 2

## 2022-04-02 MED ORDER — CYCLOBENZAPRINE HCL 5 MG PO TABS
10.0000 mg | ORAL_TABLET | Freq: Once | ORAL | Status: AC
  Administered 2022-04-02: 10 mg via ORAL
  Filled 2022-04-02: qty 2

## 2022-04-02 MED ORDER — LACTATED RINGERS IV BOLUS
1000.0000 mL | Freq: Once | INTRAVENOUS | Status: AC
  Administered 2022-04-02: 1000 mL via INTRAVENOUS

## 2022-04-02 MED ORDER — CYCLOBENZAPRINE HCL 10 MG PO TABS: 10.0000 mg | ORAL_TABLET | Freq: Two times a day (BID) | ORAL | 0 refills | Status: DC | PRN

## 2022-04-02 NOTE — MAU Provider Note (Signed)
History     CSN: [id number]  Arrival date and time: 04/02/22 1609   None     Chief Complaint  Patient presents with   Fall   Pelvic Pain   HPI Teresa Blackwell is a 22 y.o. G2P1001 at [redacted]w[redacted]d who presents to MAU via EMS after a fall. She reports that around 1540 today she slipped on something in the kitchen and did a full split. She is unsure of what she slipped on. She then reports she lowered herself onto her back. She reports she did not hit her abdomen or her head. Since the incident she reports a lot of pain in her groin, especially on the right side and into her right leg. She is also reporting a lot of abdominal cramping, however this has been an ongoing occurrence for the last 3 weeks. She reports her doctor "put me on bedrest because I'm not resting enough". Pain usually occurs after walking or standing for long periods of time, however the pain is more constant today. She reports pain is currently a 7/10, but feels better when her legs are crossed.   She reports that she receives [facility] and this has been an uncomplicated pregnancy.  OB History     Gravida  2   Para  1   Term  1   Preterm  0   AB  0   Living  1      SAB  0   IAB  0   Ectopic  0   Multiple  0   Live Births  1           Past Medical History:  Diagnosis Date   Anemia    Anxiety    Depression    Medical history non-contributory    Schizophrenia (HCC)     Past Surgical History:  Procedure Laterality Date   Nexplanon insertion  11/21/2017   NO PAST SURGERIES      Family History  Problem Relation Age of Onset   Breast cancer Mother    Schizophrenia Father    Diabetes Maternal Grandmother    Hypertension Maternal Grandmother    Cancer Maternal Grandmother    HIV/AIDS Maternal Grandfather    Hypertension Paternal Grandfather     Social History   Tobacco Use   Smoking status: Never   Smokeless tobacco: Current   Tobacco comments:    vape  Vaping Use   Vaping Use:  Never used  Substance Use Topics   Alcohol use: Not Currently   Drug use: No    Allergies: No Known Allergies  Medications Prior to Admission  Medication Sig Dispense Refill Last Dose   Acetaminophen (TYLENOL PO) Take by mouth.   03/29/2022   ferrous sulfate 325 (65 FE) MG EC tablet Take 325 mg by mouth 3 (three) times daily with meals.   04/02/2022   Prenatal Vit-Fe Fumarate-FA (PRENATAL MULTIVITAMIN) TABS tablet Take 1 tablet by mouth daily at 12 noon.   04/02/2022   diphenhydrAMINE HCl (BENADRYL PO) Take by mouth.   Unknown   terconazole (TERAZOL 7) 0.4 % vaginal cream Place 1 applicator vaginally at bedtime.      Review of Systems  Constitutional: Negative.   Gastrointestinal:  Positive for abdominal pain (cramping).  Musculoskeletal:        Groin pain  All other systems reviewed and are negative.  Physical Exam  Patient Vitals for the past 24 hrs:  BP Temp Temp src Pulse Resp SpO2  04/02/22  2110 (!) 113/49 -- -- 73 -- 99 %  04/02/22 2105 -- -- -- -- -- 99 %  04/02/22 2100 -- -- -- -- -- 98 %  04/02/22 2055 -- -- -- -- -- 98 %  04/02/22 2050 -- -- -- -- -- 99 %  04/02/22 2045 -- -- -- -- -- 98 %  04/02/22 2040 -- -- -- -- -- 98 %  04/02/22 2035 -- -- -- -- -- 98 %  04/02/22 2030 -- -- -- -- -- 98 %  04/02/22 2025 -- -- -- -- -- 98 %  04/02/22 2020 -- -- -- -- -- 98 %  04/02/22 2015 -- -- -- -- -- 97 %  04/02/22 2010 -- -- -- -- -- 97 %  04/02/22 2005 -- -- -- -- -- 98 %  04/02/22 2000 -- -- -- -- -- 98 %  04/02/22 1955 -- -- -- -- -- 98 %  04/02/22 1954 -- -- -- -- -- 98 %  04/02/22 1950 -- -- -- -- -- 98 %  04/02/22 1945 -- -- -- -- -- 98 %  04/02/22 1940 -- -- -- -- -- 99 %  04/02/22 1935 -- -- -- -- -- 99 %  04/02/22 1930 -- -- -- -- -- 99 %  04/02/22 1925 -- -- -- -- -- 98 %  04/02/22 1920 -- -- -- -- -- 99 %  04/02/22 1915 -- -- -- -- -- 99 %  04/02/22 1910 -- -- -- -- -- 100 %  04/02/22 1905 -- -- -- -- -- 100 %  04/02/22 1900 -- -- -- -- -- 100 %   04/02/22 1855 -- -- -- -- -- 100 %  04/02/22 1850 -- -- -- -- -- 100 %  04/02/22 1845 -- -- -- -- -- 100 %  04/02/22 1840 -- -- -- -- -- 100 %  04/02/22 1835 -- -- -- -- -- 100 %  04/02/22 1830 -- -- -- -- -- 100 %  04/02/22 1825 -- -- -- -- -- 100 %  04/02/22 1820 -- -- -- -- -- 100 %  04/02/22 1805 -- -- -- -- -- 100 %  04/02/22 1800 -- -- -- -- -- 100 %  04/02/22 1750 -- -- -- -- -- 100 %  04/02/22 1745 -- -- -- -- -- 100 %  04/02/22 1740 -- -- -- -- -- 100 %  04/02/22 1735 -- -- -- -- -- 100 %  04/02/22 1730 -- -- -- -- -- 100 %  04/02/22 1725 -- -- -- -- -- 100 %  04/02/22 1720 -- -- -- -- -- 100 %  04/02/22 1715 -- -- -- -- -- 99 %  04/02/22 1710 -- -- -- -- -- 98 %  04/02/22 1705 -- -- -- -- -- 100 %  04/02/22 1700 -- -- -- -- -- 100 %  04/02/22 1655 -- -- -- -- -- 100 %  04/02/22 1640 -- -- -- -- -- 100 %  04/02/22 1635 -- -- -- -- -- 99 %  04/02/22 1630 -- -- -- -- -- 99 %  04/02/22 1627 -- 98.2 F (36.8 C) Oral -- -- --  04/02/22 1625 -- -- -- -- -- 99 %  04/02/22 1620 -- -- -- -- -- 100 %  04/02/22 1617 (!) 114/58 -- -- 74 16 100 %   Physical Exam Vitals and nursing note reviewed. Exam conducted with a chaperone present.  Constitutional:      General: She is not in  acute distress. Eyes:     Extraocular Movements: Extraocular movements intact.     Pupils: Pupils are equal, round, and reactive to light.  Cardiovascular:     Rate and Rhythm: Normal rate.  Pulmonary:     Effort: Pulmonary effort is normal.  Abdominal:     Palpations: Abdomen is soft.     Tenderness: There is no abdominal tenderness.     Comments: Gravid   Musculoskeletal:        General: Normal range of motion.     Cervical back: Normal range of motion.  Skin:    General: Skin is warm and dry.  Neurological:     General: No focal deficit present.     Mental Status: She is alert and oriented to person, place, and time.  Psychiatric:        Mood and Affect: Mood normal.        Behavior:  Behavior normal.   Dilation: Closed Cervical Position: Posterior Station: Ballotable Exam by:: Khristin Keleher,CNM  Results for orders placed or performed during the hospital encounter of 04/02/22 (from the past 24 hour(s))  Urinalysis, Routine w reflex microscopic -Urine, Clean Catch     Status: Abnormal   Collection Time: 04/02/22  4:50 PM  Result Value Ref Range   Color, Urine YELLOW YELLOW   APPearance HAZY (A) CLEAR   Specific Gravity, Urine 1.019 1.005 - 1.030   pH 7.0 5.0 - 8.0   Glucose, UA NEGATIVE NEGATIVE mg/dL   Hgb urine dipstick NEGATIVE NEGATIVE   Bilirubin Urine NEGATIVE NEGATIVE   Ketones, ur 80 (A) NEGATIVE mg/dL   Protein, ur 30 (A) NEGATIVE mg/dL   Nitrite NEGATIVE NEGATIVE   Leukocytes,Ua SMALL (A) NEGATIVE   RBC / HPF 0-5 0 - 5 RBC/hpf   WBC, UA 0-5 0 - 5 WBC/hpf   Bacteria, UA RARE (A) NONE SEEN   Squamous Epithelial / HPF 6-10 0 - 5 /HPF   Mucus PRESENT    NST FHR: 125 bpm, moderate variability, +15x15 accels, no decels Toco: Q 2-57mins  MAU Course  Procedures  MDM UA LR bolus Tylenol, Flexeril Extended fetal monitoring  UA with >80 ketones, leukocytes and bacteria. Will order urine culture. Patient asymptomatic. Patient noted to be contracting every 2-5 minutes. Cervix closed/thick/ballotable. LR bolus given. Patient was also given Tylenol and Flexeril due to groin pain. Given that patient did fall, will monitor patient for 4 hours.  On reassessment, patient reports pain has completely resolved, unless she stands up or moves into a certain position and then she feels groin pain. She reports contractions have resolved. Toco shows sporadic contractions. NST remains reactive and reassuring.   Reviewed patient may use Tylenol for aches/pains. Will send Flexeril to pharmacy. Encouraged to PO hydrate at home. Strict return precautions given. Return to MAU as needed. Patient has close follow up at [facility] next week.   Assessment and Plan  [redacted] weeks  gestation of pregnancy S/p fall Reactive NST  - Discharge home in stable condition - Flexeril to pharmacy - Strict return precautions. Return to MAU as needed for new/worsening symptoms - Keep OB appointment as scheduled next week  Renee Harder, CNM 04/02/2022, 9:14 PM

## 2022-04-02 NOTE — MAU Note (Signed)
.  Teresa Blackwell is a 22 y.o. at [redacted]w[redacted]d here in MAU reporting: fall approx 50 minutes ago. States she went down in a split, did not hit her abd but did hit her lower on the door. Reports positive fetal movement since fall , denies bleeding.  Onset of complaint: 1540 Pain score: 7/10 Vitals:   04/02/22 1617  BP: (!) 114/58  Pulse: 74  Resp: 16  SpO2: 100%     FHT:150 Lab orders placed from triage:

## 2022-04-04 LAB — CULTURE, OB URINE: Culture: <10000 — AB

## 2022-04-26 LAB — OB RESULTS CONSOLE GBS: GBS: NEGATIVE

## 2022-04-28 ENCOUNTER — Inpatient Hospital Stay (HOSPITAL_COMMUNITY)
Admission: AD | Admit: 2022-04-28 | Discharge: 2022-04-28 | Disposition: A | Discharge status: Home / Self Care | Payer: Medicaid Other | Attending: Obstetrics and Gynecology | Admitting: Obstetrics and Gynecology

## 2022-04-28 ENCOUNTER — Encounter (HOSPITAL_COMMUNITY): Payer: Self-pay | Admitting: Obstetrics and Gynecology

## 2022-04-28 DIAGNOSIS — R12 Heartburn: Secondary | ICD-10-CM | POA: Insufficient documentation

## 2022-04-28 DIAGNOSIS — R102 Pelvic and perineal pain: Secondary | ICD-10-CM | POA: Insufficient documentation

## 2022-04-28 DIAGNOSIS — O26893 Other specified pregnancy related conditions, third trimester: Secondary | ICD-10-CM | POA: Insufficient documentation

## 2022-04-28 DIAGNOSIS — R112 Nausea with vomiting, unspecified: Secondary | ICD-10-CM

## 2022-04-28 DIAGNOSIS — O212 Late vomiting of pregnancy: Secondary | ICD-10-CM | POA: Insufficient documentation

## 2022-04-28 DIAGNOSIS — Z3A37 37 weeks gestation of pregnancy: Secondary | ICD-10-CM | POA: Diagnosis not present

## 2022-04-28 DIAGNOSIS — R1084 Generalized abdominal pain: Secondary | ICD-10-CM

## 2022-04-28 DIAGNOSIS — R519 Headache, unspecified: Secondary | ICD-10-CM

## 2022-04-28 LAB — WET PREP, GENITAL
Clue Cells Wet Prep HPF POC: NONE SEEN
Sperm: NONE SEEN
Trich, Wet Prep: NONE SEEN
WBC, Wet Prep HPF POC: >=10 — AB (ref <10)
Yeast Wet Prep HPF POC: NONE SEEN

## 2022-04-28 LAB — URINALYSIS, ROUTINE W REFLEX MICROSCOPIC
Bacteria, UA: NONE SEEN
Bilirubin Urine: NEGATIVE
Glucose, UA: NEGATIVE mg/dL
Hgb urine dipstick: NEGATIVE
Ketones, ur: NEGATIVE mg/dL
Nitrite: NEGATIVE
Protein, ur: NEGATIVE mg/dL
Specific Gravity, Urine: 1.009 (ref 1.005–1.030)
pH: 9 — ABNORMAL HIGH (ref 5.0–8.0)

## 2022-04-28 MED ORDER — FAMOTIDINE 20 MG PO TABS: 10.0000 mg | ORAL_TABLET | Freq: Once | ORAL | Status: DC

## 2022-04-28 MED ORDER — ACETAMINOPHEN-CAFFEINE 500-65 MG PO TABS
1.0000 | ORAL_TABLET | Freq: Once | ORAL | Status: AC
  Administered 2022-04-28: 1 via ORAL
  Filled 2022-04-28: qty 1

## 2022-04-28 MED ORDER — ONDANSETRON 4 MG PO TBDP
4.0000 mg | ORAL_TABLET | Freq: Once | ORAL | Status: AC
  Administered 2022-04-28: 4 mg via ORAL
  Filled 2022-04-28: qty 1

## 2022-04-28 MED ORDER — ALUM & MAG HYDROXIDE-SIMETH 200-200-20 MG/5ML PO SUSP
30.0000 mL | Freq: Once | ORAL | Status: AC
  Administered 2022-04-28: 30 mL via ORAL
  Filled 2022-04-28: qty 30

## 2022-04-28 MED ORDER — CYCLOBENZAPRINE HCL 5 MG PO TABS
10.0000 mg | ORAL_TABLET | Freq: Once | ORAL | Status: AC
  Administered 2022-04-28: 10 mg via ORAL
  Filled 2022-04-28: qty 2

## 2022-04-28 MED ORDER — LIDOCAINE VISCOUS HCL 2 % MT SOLN
15.0000 mL | Freq: Once | OROMUCOSAL | Status: AC
  Administered 2022-04-28: 15 mL via ORAL
  Filled 2022-04-28: qty 15

## 2022-04-28 MED ORDER — FAMOTIDINE 20 MG PO TABS
20.0000 mg | ORAL_TABLET | Freq: Once | ORAL | Status: AC
  Administered 2022-04-28: 20 mg via ORAL
  Filled 2022-04-28: qty 1

## 2022-04-28 NOTE — MAU Note (Addendum)
...  Teresa Blackwell is a 22 y.o. at 77w4dhere in MAU reporting: Intermittent lower abdominal pain that wraps up to the top of her abdomen for the past three days. She reports she has been so uncomfortable over the past three days. She reports when she woke up this morning she felt as if she could not breathe. She reports she was lying directly on her back and when she sat up she felt dizzy. She reports she had two episodes of diarrhea yesterday and has been urinating every 10 minutes and does not feel as if she is emptying her bladder. Denies VB or LOF. +FM.  Closed last week in office.  Patient breathing short shallow breaths in triage. Patient states she is unsure if her breathing is due to her pain. RR 22. She reports she is feeling anxious as she did not experience this with her first pregnancy.  Onset of complaint: x3 days  Pain score:  10/10 lower abdomen 9/10 lower back   FHT: 135 doppler Lab orders placed from triage:  MAU Labor Eval

## 2022-04-28 NOTE — MAU Provider Note (Signed)
History     CSN: [id number]  Arrival date and time: 04/28/22 1305   Event Date/Time   First Provider Initiated Contact with Patient 04/28/22 1427      Chief Complaint  Patient presents with   Abdominal Pain   Back Pain   Contractions   Teresa Blackwell , a  22 y.o. G2P1001 at 53w4dpresents to MAU with complaints of abdominal pain, urinary symptoms and feeling lightheaded and dizzy. Patient states this began last night after laying down. She states she was laying on her back and then sat up and began to feel lightheaded and dizzy, then became short of breath. She states when she stands up initially is the worse.  She states she is also feeling upper abdominal pain that comes and goes and is worsened by fetal movement. She states when this pain occurs she gets a shooting pain down in her pelvis.   Patient states she is also having urinary frequency. She states she goes to the bathroom and feel like she does not completely empty her bladder and will have to go again within 10 mins. She denies urine having an odor or color but states she feels pressure with urination. She denies vaginal bleeding and abnormal vaginal discharge. She endorses positive fetal movement. She states she is also very nauseated with standing and states she placed a scop patch on yesterday that has helped keep the nausea at bTolono         OB History     Gravida  2   Para  1   Term  1   Preterm  0   AB  0   Living  1      SAB  0   IAB  0   Ectopic  0   Multiple  0   Live Births  1           Past Medical History:  Diagnosis Date   Anemia    Anxiety    Depression    Medical history non-contributory    [place]ia [place]     Past Surgical History:  Procedure Laterality Date   Nexplanon insertion  11/21/2017   NO PAST SURGERIES      Family History  Problem Relation Age of Onset   Breast cancer Mother    Schizophrenia Father    Diabetes Maternal Grandmother    Hypertension  Maternal Grandmother    Cancer Maternal Grandmother    HIV/AIDS Maternal Grandfather    Hypertension Paternal Grandfather     Social History   Tobacco Use   Smoking status: Never   Smokeless tobacco: Current   Tobacco comments:    vape  Vaping Use   Vaping Use: Never used  Substance Use Topics   Alcohol use: Not Currently   Drug use: No    Allergies: No Known Allergies  Medications Prior to Admission  Medication Sig Dispense Refill Last Dose   Acetaminophen (TYLENOL PO) Take by mouth.   Past Month   cyclobenzaprine (FLEXERIL) 10 MG tablet Take 1 tablet (10 mg total) by mouth 2 (two) times daily as needed for muscle spasms. 20 tablet 0 Past Month   ferrous sulfate 325 (65 FE) MG EC tablet Take 325 mg by mouth 3 (three) times daily with meals.   Past Week   Prenatal Vit-Fe Fumarate-FA (PRENATAL MULTIVITAMIN) TABS tablet Take 1 tablet by mouth daily at 12 noon.   04/27/2022   diphenhydrAMINE HCl (BENADRYL PO) Take by mouth.  terconazole (TERAZOL 7) 0.4 % vaginal cream Place 1 applicator vaginally at bedtime.       Review of Systems  Constitutional:  Negative for chills, fatigue and fever.  Eyes:  Negative for pain and visual disturbance.  Respiratory:  Positive for shortness of breath. Negative for apnea and wheezing.   Cardiovascular:  Negative for chest pain and palpitations.  Gastrointestinal:  Positive for diarrhea. Negative for abdominal pain, constipation, nausea and vomiting.  Genitourinary:  Positive for frequency, pelvic pain and urgency. Negative for difficulty urinating, dysuria, vaginal bleeding, vaginal discharge and vaginal pain.  Musculoskeletal:  Negative for back pain.  Neurological:  Positive for headaches. Negative for seizures and weakness.  Psychiatric/Behavioral:  Negative for suicidal ideas.    Physical Exam   Blood pressure (!) 118/56, pulse 82, temperature 98 F (36.7 C), temperature source Oral, resp. rate (!) 22, height 5' 1"$  (1.549 m), weight  64.3 kg, last menstrual period 06/23/2021, SpO2 100 %, currently breastfeeding.  Physical Exam Vitals and nursing note reviewed. Exam conducted with a chaperone present.  Constitutional:      General: She is not in acute distress.    Appearance: Normal appearance.  HENT:     Head: Normocephalic.  Pulmonary:     Effort: Pulmonary effort is normal.  Abdominal:     Palpations: Abdomen is soft.     Tenderness: There is no abdominal tenderness.  Genitourinary:    Vagina: Tenderness present.  Musculoskeletal:     Cervical back: Normal range of motion.  Skin:    General: Skin is warm and dry.  Neurological:     Mental Status: She is alert and oriented to person, place, and time.  Psychiatric:        Mood and Affect: Mood normal.    FHT: 135bpm with moderate variability, 15x15 accels no decels  Toco: occasional (patient unaware)  Dilation: Closed Exam by:: shay Jagjit Riner cnm  MAU Course  Procedures Orders Placed This Encounter  Procedures   Wet prep, genital   Urinalysis, Routine w reflex microscopic -Urine, Clean Catch   Discharge patient   Meds ordered this encounter  Medications   acetaminophen-caffeine (EXCEDRIN TENSION HEADACHE) 500-65 MG per tablet 1 tablet   cyclobenzaprine (FLEXERIL) tablet 10 mg   ondansetron (ZOFRAN-ODT) disintegrating tablet 4 mg   AND Linked Order Group    alum & mag hydroxide-simeth (MAALOX/MYLANTA) [id number] MG/5ML suspension 30 mL    lidocaine (XYLOCAINE) 2 % viscous mouth solution 15 mL   DISCONTD: famotidine (PEPCID) tablet 10 mg   famotidine (PEPCID) tablet 20 mg    MDM - ph 9.0 with Trace leuks and otherwise normal  - Wet prep positive for WBC otherwise normal . - GC pending upon discharge.  - Patient winces at pain with fetal movement.  - Her headache in now a 0/0 following PO Excedrin.  - Still complains of nausea and vomiting despite scop patch. ODT Zofran ordered. Counseled patient that the "feeling: of nausea may not go away, but  as long as she is keeping foods down, medication is working.  - Reassessment at 1630- Patient reports heartburn and upper left abdominal pain.  - GI cocktail and Pepcid ordered.  - Cervix closed; low suspicion for preterm labor.  - Plan for discharge   Assessment and Plan   1. Vaginal pain   2. Pregnancy headache in third trimester   3. Generalized abdominal pain   4. Nausea and vomiting, unspecified vomiting type   5. Heartburn during pregnancy in third  trimester   6. [redacted] weeks gestation of pregnancy    - Reviewed that abdominal discomfort can be very normal at this stage of pregnancy.  - Discussed comfort measures and things to do at home to relieve symptoms.  - Also reviewed heart burn and indigestion can be normal discomforts of pregnancy.  - Worsening signs and return precautions reviewed.  - FHT appropriate for gestational age at time of discharge.  - Term labor precautions reviewed.  - Patient discharged home in stable condition and may return to MAU as needed.   Jacquiline Doe, MSN CNM  04/28/2022, 4:54 PM

## 2022-04-29 LAB — GC/CHLAMYDIA PROBE AMP (~~LOC~~) NOT AT ARMC
Chlamydia: NEGATIVE
Comment: NEGATIVE
Comment: NORMAL
Neisseria Gonorrhea: NEGATIVE

## 2022-05-10 ENCOUNTER — Inpatient Hospital Stay (HOSPITAL_COMMUNITY)
Admission: AD | Admit: 2022-05-10 | Discharge: 2022-05-10 | Disposition: A | Discharge status: Home / Self Care | Payer: Medicaid Other | Attending: Obstetrics and Gynecology | Admitting: Obstetrics and Gynecology

## 2022-05-10 ENCOUNTER — Encounter (HOSPITAL_COMMUNITY): Payer: Self-pay | Admitting: Obstetrics and Gynecology

## 2022-05-10 DIAGNOSIS — O471 False labor at or after 37 completed weeks of gestation: Secondary | ICD-10-CM | POA: Diagnosis present

## 2022-05-10 DIAGNOSIS — Z3A39 39 weeks gestation of pregnancy: Secondary | ICD-10-CM | POA: Insufficient documentation

## 2022-05-10 MED ORDER — CYCLOBENZAPRINE HCL 5 MG PO TABS: 10.0000 mg | ORAL_TABLET | Freq: Once | ORAL | Status: DC

## 2022-05-10 NOTE — Discharge Instructions (Addendum)
Safe Medications in Pregnancy   Acne: Benzoyl Peroxide Salicylic Acid  Backache/Headache: Tylenol: 2 regular strength every 4 hours OR              2 Extra strength every 6 hours  Colds/Coughs/Allergies: Benadryl (alcohol free) 25 mg every 6 hours as needed Breath right strips Claritin Cepacol throat lozenges Chloraseptic throat spray Cold-Eeze- up to three times per day Cough drops, alcohol free Flonase (by prescription only) Guaifenesin Mucinex Robitussin DM (plain only, alcohol free) Saline nasal spray/drops Sudafed (pseudoephedrine) & Actifed ** use only after [redacted] weeks gestation and if you do not have high blood pressure Tylenol Vicks Vaporub Zinc lozenges Zyrtec   Constipation: Colace Ducolax suppositories Fleet enema Glycerin suppositories Metamucil Milk of magnesia Miralax Senokot Smooth move tea  Diarrhea: Kaopectate Imodium A-D  *NO pepto Bismol  Hemorrhoids: Anusol Anusol HC Preparation H Tucks  Indigestion: Tums Maalox Mylanta Zantac  Pepcid  Insomnia: Benadryl (alcohol free) '25mg'$  every 6 hours as needed Tylenol PM Unisom, no Gelcaps  Leg Cramps: Tums MagGel  Nausea/Vomiting:  Bonine Dramamine Emetrol Ginger extract Sea bands Meclizine  Nausea medication to take during pregnancy:  Unisom (doxylamine succinate 25 mg tablets) Take one tablet daily at bedtime. If symptoms are not adequately controlled, the dose can be increased to a maximum recommended dose of two tablets daily (1/2 tablet in the morning, 1/2 tablet mid-afternoon and one at bedtime). Vitamin B6 '100mg'$  tablets. Take one tablet twice a day (up to 200 mg per day).  Skin Rashes: Aveeno products Benadryl cream or '25mg'$  every 6 hours as needed Calamine Lotion 1% cortisone cream  Yeast infection: Gyne-lotrimin 7 Monistat 7   **If taking multiple medications, please check labels to avoid duplicating the same active ingredients **take medication as directed on  the label ** Do not exceed 4000 mg of tylenol in 24 hours **Do not take medications that contain aspirin or ibuprofen   The MilesCircuit This circuit takes at least 90 minutes to complete so clear your schedule and make mental preparations so you can relax in your environment.  The second step requires a lot of pillows so gather them up before beginning.  Before starting, you should empty your bladder! Have a nice drink nearby, and make sure it has a straw! If you are having contractions, this circuit should be done through contractions, try not to change positions between steps.  Step One: Open-kneeChest Stay in this position for 30 minutes, start in cat/cow, then drop your chest as low as you can to the bed or the floor and your bottom as high as you can. Knees should be fairly wide apart, and the angle between the torso/thighs should be wider than 90 degrees. Wiggle around, prop with lots of pillows and use this time to get totally relaxed. This position allows the baby to scoot out of the pelvis a bit and gives them room to rotate, shift their head position, etc. If the pregnant person finds it helpful, careful positioning with a rebozo under the belly, with gentle tension from a support person behind can help maintain this position for the full 30 minutes.  Step [id number] SideLying Roll to your left side, bringing your top leg as high as possible and keeping your bottom leg straight. Roll forward as much as possible, again using a lot of pillows. Sink into the bed and relax some more. If you fall asleep, that's totally okay and you can stay there! If not, stay here for at least another  half an hour. Try and get your top right leg up towards your head and get as rolled over onto your belly as much as possible. If you repeat the circuit during labor, try alternating left and right sides. We know the photo the left is actually right side... just flip the  image in your head.  Step Three: Moving and Lunges Lunge, walk stairs facing sideways, 2 at a time, (have a spotter downstairs of you!), take a walk outside with one foot on the curb and the other on the street, sit on a birth ball and hula- anything that's upright and putting your pelvis in open, asymmetrical positions. Spend at least 30 minutes doing this one as well to give your baby a chance to move down. If you are lunging or stair or curb walking, you should lunge/walk/go up stairs in the direction that feels better to you. The key with the lunge is that the toes of the higher leg and mom's belly button should be at right angles. Do not lunge over your knee, that closes the pelvis.  You got this!!!!!!!!!!!!!!!!!!!!!!! :)

## 2022-05-10 NOTE — MAU Provider Note (Signed)
S: Ms. Teresa Blackwell is a 22 y.o. G2P1001 at [redacted]w[redacted]d who presents to MAU today complaining contractions q 5-10 minutes since this afternoon. She denies vaginal bleeding. She denies LOF. She reports normal fetal movement.    O: BP (!) 109/49   Pulse 79   Temp 98.2 F (36.8 C) (Oral)   Resp 20   LMP 06/23/2021 (Exact Date)   SpO2 99%  GENERAL: Well-developed, well-nourished female in no acute distress.  HEAD: Normocephalic, atraumatic.  CHEST: Normal effort of breathing, regular heart rate ABDOMEN: Soft, nontender, gravid  Cervical exam:  Dilation: 1 Exam by:: Holly Flippin RN   Fetal Monitoring: Baseline: 130 Variability: moderate Accelerations: 15x15 Decelerations: none Contractions: 10-15  Patient rechecked after 1 hour and unchanged  A: SIUP at 385w2dFalse labor  P: 1. False labor after 37 completed weeks of gestation   2. [redacted] weeks gestation of pregnancy    -Discharge home in stable condition -Labor precautions discussed -Patient advised to follow-up with OB as scheduled for prenatal care -Patient may return to MAU as needed or if her condition were to change or worsen   Ne[place]ende [place]/06/2022 6:15 PM

## 2022-05-13 ENCOUNTER — Encounter (HOSPITAL_COMMUNITY): Payer: Self-pay | Admitting: Obstetrics and Gynecology

## 2022-05-13 ENCOUNTER — Inpatient Hospital Stay (EMERGENCY_DEPARTMENT_HOSPITAL)
Admission: AD | Admit: 2022-05-13 | Discharge: 2022-05-14 | Disposition: A | Discharge status: Home / Self Care | Payer: Medicaid Other | Source: Home / Self Care | Attending: Obstetrics and Gynecology | Admitting: Obstetrics and Gynecology

## 2022-05-13 DIAGNOSIS — O479 False labor, unspecified: Secondary | ICD-10-CM | POA: Insufficient documentation

## 2022-05-13 DIAGNOSIS — Z3A39 39 weeks gestation of pregnancy: Secondary | ICD-10-CM | POA: Insufficient documentation

## 2022-05-13 DIAGNOSIS — Z3689 Encounter for other specified antenatal screening: Secondary | ICD-10-CM

## 2022-05-13 NOTE — MAU Note (Signed)
.  Teresa Blackwell is a 22 y.o. at 12w5dhere in MAU reporting ctxs since 2215. Having a lot of pelvic pressure.  Reports good FM . 1cm last sve.   Onset of complaint: 2215 Pain score: 9 Vitals:   05/13/22 2336  Pulse: 93  Resp: 18  Temp: 98.3 F (36.8 C)  SpO2: 100%     FHT:161 Lab orders placed from triage:

## 2022-05-14 DIAGNOSIS — Z3A39 39 weeks gestation of pregnancy: Secondary | ICD-10-CM | POA: Diagnosis not present

## 2022-05-14 DIAGNOSIS — O479 False labor, unspecified: Secondary | ICD-10-CM

## 2022-05-14 NOTE — MAU Provider Note (Signed)
Patient was assessed for active labor and managed by nursing staff during this encounter. I have reviewed the chart and agree with the documentation and plan. I have also reviewed the NST for appropriate reactivity.  Fetal Tracing: reactive Baseline: 140 Variability: moderate Accelerations: 15x15  Decelerations: one variable, otherwise none Toco: q63mn+ at discharge   Patient stable for discharge home with labor precautions.  JGaylan Gerold CNM, MSN, ICenter MorichesCertified Nurse Midwife, CNanticoke AcresGroup 05/14/22 at 1:03 AM

## 2022-05-14 NOTE — Discharge Instructions (Signed)

## 2022-05-15 ENCOUNTER — Other Ambulatory Visit: Payer: Self-pay

## 2022-05-15 ENCOUNTER — Inpatient Hospital Stay (HOSPITAL_COMMUNITY): Payer: Medicaid Other | Admitting: Anesthesiology

## 2022-05-15 ENCOUNTER — Encounter (HOSPITAL_COMMUNITY): Payer: Self-pay | Admitting: Obstetrics and Gynecology

## 2022-05-15 ENCOUNTER — Inpatient Hospital Stay (HOSPITAL_COMMUNITY)
Admission: AD | Admit: 2022-05-15 | Discharge: 2022-05-17 | DRG: 807 | Disposition: A | Discharge status: Home / Self Care | Payer: Medicaid Other | Attending: Obstetrics and Gynecology | Admitting: Obstetrics and Gynecology

## 2022-05-15 DIAGNOSIS — F32A Depression, unspecified: Secondary | ICD-10-CM | POA: Diagnosis present

## 2022-05-15 DIAGNOSIS — O26893 Other specified pregnancy related conditions, third trimester: Secondary | ICD-10-CM | POA: Diagnosis present

## 2022-05-15 DIAGNOSIS — O99344 Other mental disorders complicating childbirth: Secondary | ICD-10-CM | POA: Diagnosis present

## 2022-05-15 DIAGNOSIS — O9902 Anemia complicating childbirth: Secondary | ICD-10-CM | POA: Diagnosis present

## 2022-05-15 DIAGNOSIS — Z3A40 40 weeks gestation of pregnancy: Secondary | ICD-10-CM | POA: Diagnosis not present

## 2022-05-15 DIAGNOSIS — E559 Vitamin D deficiency, unspecified: Secondary | ICD-10-CM | POA: Insufficient documentation

## 2022-05-15 DIAGNOSIS — O99013 Anemia complicating pregnancy, third trimester: Secondary | ICD-10-CM | POA: Diagnosis present

## 2022-05-15 LAB — CBC
HCT: 31.6 % — ABNORMAL LOW (ref 36.0–46.0)
Hemoglobin: 10.6 g/dL — ABNORMAL LOW (ref 12.0–15.0)
MCH: 31.3 pg (ref 26.0–34.0)
MCHC: 33.5 g/dL (ref 30.0–36.0)
MCV: 93.2 fL (ref 80.0–100.0)
Platelets: 279 10*3/uL (ref 150–400)
RBC: 3.39 MIL/uL — ABNORMAL LOW (ref 3.87–5.11)
RDW: 12.8 % (ref 11.5–15.5)
WBC: 10.6 10*3/uL — ABNORMAL HIGH (ref 4.0–10.5)
nRBC: 0 % (ref 0.0–0.2)

## 2022-05-15 LAB — TYPE AND SCREEN
ABO/RH(D): A POS
Antibody Screen: NEGATIVE

## 2022-05-15 LAB — HIV ANTIBODY (ROUTINE TESTING W REFLEX): HIV Screen 4th Generation wRfx: NONREACTIVE

## 2022-05-15 LAB — POCT FERN TEST: POCT Fern Test: POSITIVE

## 2022-05-15 MED ORDER — LACTATED RINGERS IV SOLN: INTRAVENOUS | Status: DC

## 2022-05-15 MED ORDER — FLEET ENEMA 7-19 GM/118ML RE ENEM: 1.0000 | ENEMA | RECTAL | Status: DC | PRN

## 2022-05-15 MED ORDER — TETANUS-DIPHTH-ACELL PERTUSSIS 5-2.5-18.5 LF-MCG/0.5 IM SUSY: 0.5000 mL | PREFILLED_SYRINGE | Freq: Once | INTRAMUSCULAR | Status: DC

## 2022-05-15 MED ORDER — ZOLPIDEM TARTRATE 5 MG PO TABS: 5.0000 mg | ORAL_TABLET | Freq: Every evening | ORAL | Status: DC | PRN

## 2022-05-15 MED ORDER — LACTATED RINGERS IV SOLN
500.0000 mL | Freq: Once | INTRAVENOUS | Status: AC
  Administered 2022-05-15: 500 mL via INTRAVENOUS

## 2022-05-15 MED ORDER — ONDANSETRON HCL 4 MG/2ML IJ SOLN: 4.0000 mg | Freq: Four times a day (QID) | INTRAMUSCULAR | Status: DC | PRN

## 2022-05-15 MED ORDER — DIPHENHYDRAMINE HCL 50 MG/ML IJ SOLN: 12.5000 mg | INTRAMUSCULAR | Status: DC | PRN

## 2022-05-15 MED ORDER — PHENYLEPHRINE 80 MCG/ML (10ML) SYRINGE FOR IV PUSH (FOR BLOOD PRESSURE SUPPORT): 80.0000 ug | PREFILLED_SYRINGE | INTRAVENOUS | Status: DC | PRN

## 2022-05-15 MED ORDER — EPHEDRINE 5 MG/ML INJ
10.0000 mg | INTRAVENOUS | Status: DC | PRN
  Administered 2022-05-15: 10 mg via INTRAVENOUS

## 2022-05-15 MED ORDER — ACETAMINOPHEN 325 MG PO TABS: 650.0000 mg | ORAL_TABLET | ORAL | Status: DC | PRN

## 2022-05-15 MED ORDER — OXYTOCIN-SODIUM CHLORIDE 30-0.9 UT/500ML-% IV SOLN
1.0000 m[IU]/min | INTRAVENOUS | Status: DC
  Administered 2022-05-15: 2 m[IU]/min via INTRAVENOUS

## 2022-05-15 MED ORDER — LIDOCAINE HCL (PF) 1 % IJ SOLN
INTRAMUSCULAR | Status: DC | PRN
  Administered 2022-05-15: 5 mL via EPIDURAL
  Administered 2022-05-15: 2 mL via EPIDURAL
  Administered 2022-05-15: 3 mL via EPIDURAL

## 2022-05-15 MED ORDER — DIPHENHYDRAMINE HCL 25 MG PO CAPS: 25.0000 mg | ORAL_CAPSULE | Freq: Four times a day (QID) | ORAL | Status: DC | PRN

## 2022-05-15 MED ORDER — SENNOSIDES-DOCUSATE SODIUM 8.6-50 MG PO TABS
2.0000 | ORAL_TABLET | Freq: Every day | ORAL | Status: DC
  Administered 2022-05-16 – 2022-05-17 (×2): 2 via ORAL
  Filled 2022-05-15 (×2): qty 2

## 2022-05-15 MED ORDER — IBUPROFEN 600 MG PO TABS
600.0000 mg | ORAL_TABLET | Freq: Four times a day (QID) | ORAL | Status: DC
  Administered 2022-05-15 – 2022-05-17 (×8): 600 mg via ORAL
  Filled 2022-05-15 (×8): qty 1

## 2022-05-15 MED ORDER — OXYCODONE HCL 5 MG PO TABS: 5.0000 mg | ORAL_TABLET | ORAL | Status: DC | PRN

## 2022-05-15 MED ORDER — PRENATAL MULTIVITAMIN CH
1.0000 | ORAL_TABLET | Freq: Every day | ORAL | Status: DC
  Administered 2022-05-16 – 2022-05-17 (×2): 1 via ORAL
  Filled 2022-05-15 (×2): qty 1

## 2022-05-15 MED ORDER — OXYCODONE-ACETAMINOPHEN 5-325 MG PO TABS: 1.0000 | ORAL_TABLET | ORAL | Status: DC | PRN

## 2022-05-15 MED ORDER — TERBUTALINE SULFATE 1 MG/ML IJ SOLN: 0.2500 mg | Freq: Once | INTRAMUSCULAR | Status: DC | PRN

## 2022-05-15 MED ORDER — FENTANYL-BUPIVACAINE-NACL 0.5-0.125-0.9 MG/250ML-% EP SOLN
12.0000 mL/h | EPIDURAL | Status: DC | PRN
  Administered 2022-05-15: 12 mL/h via EPIDURAL
  Filled 2022-05-15: qty 250

## 2022-05-15 MED ORDER — BENZOCAINE-MENTHOL 20-0.5 % EX AERO
1.0000 | INHALATION_SPRAY | CUTANEOUS | Status: DC | PRN
  Administered 2022-05-15 – 2022-05-17 (×2): 1 via TOPICAL
  Filled 2022-05-15 (×2): qty 56

## 2022-05-15 MED ORDER — SOD CITRATE-CITRIC ACID 500-334 MG/5ML PO SOLN: 30.0000 mL | ORAL | Status: DC | PRN

## 2022-05-15 MED ORDER — OXYTOCIN-SODIUM CHLORIDE 30-0.9 UT/500ML-% IV SOLN
2.5000 [IU]/h | INTRAVENOUS | Status: DC
  Filled 2022-05-15: qty 500

## 2022-05-15 MED ORDER — ONDANSETRON HCL 4 MG PO TABS: 4.0000 mg | ORAL_TABLET | ORAL | Status: DC | PRN

## 2022-05-15 MED ORDER — LACTATED RINGERS IV SOLN
500.0000 mL | INTRAVENOUS | Status: DC | PRN
  Administered 2022-05-15: 1000 mL via INTRAVENOUS

## 2022-05-15 MED ORDER — OXYTOCIN BOLUS FROM INFUSION
333.0000 mL | Freq: Once | INTRAVENOUS | Status: AC
  Administered 2022-05-15: 333 mL via INTRAVENOUS

## 2022-05-15 MED ORDER — WITCH HAZEL-GLYCERIN EX PADS: 1.0000 | MEDICATED_PAD | CUTANEOUS | Status: DC | PRN

## 2022-05-15 MED ORDER — SIMETHICONE 80 MG PO CHEW
80.0000 mg | CHEWABLE_TABLET | ORAL | Status: DC | PRN
  Administered 2022-05-16: 80 mg via ORAL
  Filled 2022-05-15: qty 1

## 2022-05-15 MED ORDER — LIDOCAINE HCL (PF) 1 % IJ SOLN: 30.0000 mL | INTRAMUSCULAR | Status: DC | PRN

## 2022-05-15 MED ORDER — ACETAMINOPHEN 325 MG PO TABS
650.0000 mg | ORAL_TABLET | ORAL | Status: DC | PRN
  Administered 2022-05-16: 650 mg via ORAL
  Filled 2022-05-15: qty 2

## 2022-05-15 MED ORDER — DIBUCAINE (PERIANAL) 1 % EX OINT: 1.0000 | TOPICAL_OINTMENT | CUTANEOUS | Status: DC | PRN

## 2022-05-15 MED ORDER — EPHEDRINE 5 MG/ML INJ
10.0000 mg | INTRAVENOUS | Status: DC | PRN
  Filled 2022-05-15: qty 5

## 2022-05-15 MED ORDER — OXYCODONE HCL 5 MG PO TABS: 10.0000 mg | ORAL_TABLET | ORAL | Status: DC | PRN

## 2022-05-15 MED ORDER — OXYCODONE-ACETAMINOPHEN 5-325 MG PO TABS: 2.0000 | ORAL_TABLET | ORAL | Status: DC | PRN

## 2022-05-15 MED ORDER — FENTANYL CITRATE (PF) 100 MCG/2ML IJ SOLN: 50.0000 ug | INTRAMUSCULAR | Status: DC | PRN

## 2022-05-15 MED ORDER — ONDANSETRON HCL 4 MG/2ML IJ SOLN: 4.0000 mg | INTRAMUSCULAR | Status: DC | PRN

## 2022-05-15 MED ORDER — LACTATED RINGERS IV SOLN: 500.0000 mL | Freq: Once | INTRAVENOUS | Status: DC

## 2022-05-15 MED ORDER — COCONUT OIL OIL
1.0000 | TOPICAL_OIL | Status: DC | PRN
  Administered 2022-05-17: 1 via TOPICAL

## 2022-05-15 NOTE — Lactation Note (Signed)
This note was copied from a baby's chart. Lactation Consultation Note  Patient Name: Teresa Blackwell [id number] Date: 05/15/2022 Age:22 years Reason for consult: Initial assessment;Term;Breastfeeding assistance  LC entered the room and the infant was STS with the birth parent.  Per the birth parent the infant has been feeding well.  She stated that she had no questions or concerns.  The birth parent commented that she remembers how to hand express.  Her older child is 13 years old and she breast fed him for 4 months.  The birth parent does not have a breast pump at home.  Stork pump form was sent to see if the birth parent qualifies for a breast pump.  The birth parent had no further concerns.  LC encouraged the birth parent to call for assistance with breastfeeding.   Infant Feeding Plan:  Breastfeed 8+ times in 24 hours according to feeding cues.  Hand express and feed expressed milk to the infant via a bottle.  Call [facility] for assistance with breastfeeding.    Maternal Data Has patient been taught Hand Expression?: Yes Does the patient have breastfeeding experience prior to this delivery?: Yes How long did the patient breastfeed?: 4 months  Feeding Mother's Current Feeding Choice: Breast Milk  Interventions Interventions: [facility] brochure  Discharge    Consult Status Consult Status: Follow-up Date: 05/16/22 Follow-up type: In-patient    Lysbeth Penner 05/15/2022, 5:51 PM

## 2022-05-15 NOTE — Progress Notes (Signed)
Subjective:    Doing well. Comfortable with epidural. Doula present and supportive.   Objective:    VS: BP 128/78   Pulse 100   Temp 98 F (36.7 C) (Oral)   Resp 18   Ht '5\' 1"'$  (1.549 m)   Wt 66.2 kg   LMP 06/23/2021 (Exact Date)   SpO2 99%   BMI 27.59 kg/m  FHR : baseline 135 / variability moderate / accelerations present / early decelerations Toco: contractions every 2-3 minutes  Membranes: SROM, release of fore bag, clear fluid Dilation: 9 Effacement (%): 100 Station: Plus 2 Presentation: Vertex Exam by:: Maryla Morrow CNM Pitocin 6 mU/min  Assessment/Plan:   22 y.o. G2P1001 [redacted]w[redacted]d Labor: Progressing normally Fetal Wellbeing:  Category I Pain Control:  Epidural I/D:   GBS neg Anticipated MOD:  NSVD  VArrie EasternDNP, CNM 05/15/2022 12:33 PM

## 2022-05-15 NOTE — Anesthesia Procedure Notes (Signed)
Epidural Patient location during procedure: OB  Staffing Anesthesiologist: Suzette Battiest, MD Performed: anesthesiologist   Preanesthetic Checklist Completed: patient identified, IV checked, site marked, risks and benefits discussed, surgical consent, monitors and equipment checked, pre-op evaluation and timeout performed  Epidural Patient position: sitting Prep: DuraPrep and site prepped and draped Patient monitoring: continuous pulse ox and blood pressure Approach: midline Location: L3-L4 Injection technique: LOR air  Needle:  Needle type: Tuohy  Needle gauge: 17 G Needle length: 9 cm and 9 Needle insertion depth: 6 cm Catheter type: closed end flexible Catheter size: 19 Gauge Catheter at skin depth: 12 (11-->12 when pt sat upright) cm Test dose: negative  Assessment Events: blood not aspirated, no cerebrospinal fluid, injection not painful, no injection resistance, no paresthesia and negative IV test

## 2022-05-15 NOTE — H&P (Signed)
OB ADMISSION/ HISTORY & PHYSICAL:  Admission Date: 05/15/2022  3:13 AM  Admit Diagnosis: Normal labor  Teresa Blackwell is a 22 y.o. female G2P1001 74w0dpresenting for loss of fluid at 0200. Endorses active FM, and denies vaginal bleeding.  History of current pregnancy: G2P1001   Patient entered care with [facility] at 14+4 wks.   EDC 05/15/22 by LMP  Anatomy scan:  complete w/ posterior placenta.   Last evaluation: 34  wks cephalic, normal fluid Significant prenatal events:  Patient Active Problem List   Diagnosis Date Noted   Normal labor 05/15/2022   Anemia of pregnancy in third trimester 05/15/2022   Chronic depressive disorder 05/15/2022   Vitamin D deficiency 05/15/2022    Prenatal Labs: ABO, Rh: --/--/A POS (03/09 0330) Antibody: NEG (03/09 0330) Rubella: Immune (09/22 0000)  RPR: Nonreactive (09/22 0000)  HBsAg: Negative (09/19 0000)  HIV: Non Reactive (03/09 0330)  GTT: normal 1 hr GBS: Negative/-- (02/19 0000)  GC/CHL: neg/neg Genetics: Horizon - negative Vaccines: Tdap: Declined Influenza: Yes   OB History  Gravida Para Term Preterm AB Living  '2 1 1 '$ 0 0 1  SAB IAB Ectopic Multiple Live Births  0 0 0 0 1    # Outcome Date GA Lbr Len/2nd Weight Sex Delivery Anes PTL Lv  2 Current           1 Term      Vag-Spont   LIV    Medical / Surgical History: Past medical history:  Past Medical History:  Diagnosis Date   Anemia    Anxiety    Depression    Medical history non-contributory    [place]     Past surgical history:  Past Surgical History:  Procedure Laterality Date   Nexplanon insertion  11/21/2017   NO PAST SURGERIES     Family History:  Family History  Problem Relation Age of Onset   Breast cancer Mother    Schizophrenia Father    Diabetes Maternal Grandmother    Hypertension Maternal Grandmother    Cancer Maternal Grandmother    HIV/AIDS Maternal Grandfather    Hypertension Paternal Grandfather     Social History:  reports that  she has never smoked. She uses smokeless tobacco. She reports that she does not currently use alcohol. She reports that she does not use drugs.  Allergies: Latex   Current Medications at time of admission:  Prior to Admission medications   Medication Sig Start Date End Date Taking? Authorizing Provider  Acetaminophen (TYLENOL PO) Take by mouth.    [provider]  cyclobenzaprine (FLEXERIL) 10 MG tablet Take 1 tablet (10 mg total) by mouth 2 (two) times daily as needed for muscle spasms. 04/02/22   SRenee Harder CNM  diphenhydrAMINE HCl (BENADRYL PO) Take by mouth.    [provider]  ferrous sulfate 325 (65 FE) MG EC tablet Take 325 mg by mouth 3 (three) times daily with meals.    [provider]  Prenatal Vit-Fe Fumarate-FA (PRENATAL MULTIVITAMIN) TABS tablet Take 1 tablet by mouth daily at 12 noon.    [provider]    Review of Systems: Constitutional: Negative   HENT: Negative   Eyes: Negative   Respiratory: Negative   Cardiovascular: Negative   Gastrointestinal: Negative  Genitourinary: pos for bloody show, pos for LOF   Musculoskeletal: Negative   Skin: Negative   Neurological: Negative   Endo/Heme/Allergies: Negative   Psychiatric/Behavioral: Negative    Physical Exam: VS: Blood pressure (Marland Kitchen  106/55, pulse 86, temperature (!) 97.4 F (36.3 C), temperature source Oral, resp. rate 18, height '5\' 1"'$  (1.549 m), weight 66.2 kg, last menstrual period 06/23/2021, SpO2 99 %, currently breastfeeding. AAO x3, no signs of distress Cardiovascular: RRR Respiratory: Unlabored GU/GI: Abdomen gravid, non-tender, non-distended, active FM, vertex Extremities: no edema, negative for pain, tenderness, and cords  Cervical exam:Dilation: 6 Effacement (%): 90 Station: -2, -1 Exam by:: M. Padgett RN FHR: baseline rate 135 / variability moderate / accelerations present / absent decelerations TOCO: 2-5   Prenatal Transfer Tool  Maternal Diabetes:  No Genetic Screening: Normal Maternal Ultrasounds/Referrals: Normal Fetal Ultrasounds or other Referrals:  None Maternal Substance Abuse:  No Significant Maternal Medications:  None Significant Maternal Lab Results: Group B Strep negative Number of Prenatal Visits:greater than 3 verified prenatal visits Other Comments:   hx of depressive disorder , prozac pre-preg, no meds during pregnancy    Assessment: 22 y.o. G2P1001 105w0dSROM  Latent stage of labor FHR category 1 GBS neg Pain management plan: epidural   Plan:  Admit to L&D Routine admission orders Epidural PRN Expectant management Augment labor if protracted Dr EOrlie Dakinnotified of admission and plan of care  VArrie EasternDNP, CNM 05/15/2022 10:09 AM

## 2022-05-15 NOTE — MAU Note (Signed)
.  Teresa Blackwell is a 22 y.o. at [redacted]w[redacted]d here in MAU reporting: here via EMS c/o SROM at 0245 clear fluid. Ctx every couple minutes since 0100. Rates pain 9 on 0-10 pain scale

## 2022-05-15 NOTE — Anesthesia Preprocedure Evaluation (Signed)
Anesthesia Evaluation  Patient identified by MRN, date of birth, ID band Patient awake    Reviewed: Allergy & Precautions, Patient's Chart, lab work & pertinent test results  Airway Mallampati: II  TM Distance: >3 FB     Dental   Pulmonary    breath sounds clear to auscultation       Cardiovascular negative cardio ROS  Rhythm:Regular Rate:Normal     Neuro/Psych negative neurological ROS     GI/Hepatic negative GI ROS, Neg liver ROS,,,  Endo/Other  negative endocrine ROS    Renal/GU      Musculoskeletal   Abdominal   Peds  Hematology  (+) Blood dyscrasia   Anesthesia Other Findings   Reproductive/Obstetrics (+) Pregnancy                             Anesthesia Physical Anesthesia Plan  ASA: 2  Anesthesia Plan:    Post-op Pain Management:    Induction:   PONV Risk Score and Plan: 2 and Treatment may vary due to age or medical condition  Airway Management Planned: Natural Airway  Additional Equipment:   Intra-op Plan:   Post-operative Plan:   Informed Consent: I have reviewed the patients History and Physical, chart, labs and discussed the procedure including the risks, benefits and alternatives for the proposed anesthesia with the patient or authorized representative who has indicated his/her understanding and acceptance.       Plan Discussed with:   Anesthesia Plan Comments:        Anesthesia Quick Evaluation

## 2022-05-16 LAB — CBC
HCT: 26.2 % — ABNORMAL LOW (ref 36.0–46.0)
Hemoglobin: 8.7 g/dL — ABNORMAL LOW (ref 12.0–15.0)
MCH: 31.3 pg (ref 26.0–34.0)
MCHC: 33.2 g/dL (ref 30.0–36.0)
MCV: 94.2 fL (ref 80.0–100.0)
Platelets: 240 10*3/uL (ref 150–400)
RBC: 2.78 MIL/uL — ABNORMAL LOW (ref 3.87–5.11)
RDW: 12.8 % (ref 11.5–15.5)
WBC: 15 10*3/uL — ABNORMAL HIGH (ref 4.0–10.5)
nRBC: 0 % (ref 0.0–0.2)

## 2022-05-16 LAB — RPR: RPR Ser Ql: NONREACTIVE

## 2022-05-16 MED ORDER — FLUOXETINE HCL 20 MG PO CAPS
20.0000 mg | ORAL_CAPSULE | Freq: Every day | ORAL | Status: DC
  Administered 2022-05-16 – 2022-05-17 (×2): 20 mg via ORAL
  Filled 2022-05-16 (×2): qty 1

## 2022-05-16 MED ORDER — FERROUS SULFATE 325 (65 FE) MG PO TABS
325.0000 mg | ORAL_TABLET | Freq: Every day | ORAL | Status: DC
  Administered 2022-05-16 – 2022-05-17 (×2): 325 mg via ORAL
  Filled 2022-05-16 (×2): qty 1

## 2022-05-16 NOTE — Progress Notes (Signed)
CSW acknowledges social work consult placed for history of anxiety, depression, possible schizophrenia and a history of AVH, SI/HI. When CSW entered room, MOB was observed asleep. Infant was asleep on back in bassinet. CSW to return at a later time.   Signed,  Berniece Salines, MSW, LCSWA, LCASA 05/16/2022 1:20 PM

## 2022-05-16 NOTE — Progress Notes (Addendum)
Post Partum Day 1 Subjective: no complaints, up ad lib, voiding, tolerating PO, and + flatus  Objective: Blood pressure 114/60, pulse 79, temperature 98.4 F (36.9 C), temperature source Axillary, resp. rate 16, height '5\' 1"'$  (1.549 m), weight 66.2 kg, last menstrual period 06/23/2021, SpO2 98 %, unknown if currently breastfeeding.  Physical Exam:  General: alert, cooperative, and no distress Lochia: appropriate Uterine Fundus: firm Incision: n/a DVT Evaluation: No evidence of DVT seen on physical exam.  Recent Labs    05/15/22 0330 05/16/22 0411  HGB 10.6* 8.7*  HCT 31.6* 26.2*    Assessment/Plan: Plan for discharge tomorrow, Breast and bottle feeding, Lactation consult, Social work consult, and Circumcision prior to discharge.   Anemia - PO iron supplementation.  Depression - EPDS 12 patient desires to restart Prozac 20 mg qd.  NICU at bedside for episode of infant hypoglycemia, will continue to monitor for improvement prior to circumcision.   LOS: 1 day   Josefine Class, MD 05/16/2022, 6:39 AM

## 2022-05-16 NOTE — Clinical Social Work Maternal (Addendum)
CLINICAL SOCIAL WORK MATERNAL/CHILD NOTE  Patient Details  Name: Teresa Blackwell MRN: [id number] Date of Birth: 02-Jun-2000  Date:  05/16/2022  Clinical Social Worker Initiating Note:  Idamae Lusher, LCSWA Date/Time: Initiated:  05/16/22/1633     Child's Name:  Teresa Cecil "DJ"   Biological Parents:  Mother, Father (FOB: Augustin Coupe DOB: 11/29/2000)   Need for Interpreter:  None   Reason for Referral:  Behavioral Health Concerns   Address:  [address] [place] [address]    Phone number:  [phone] (home)     Additional phone number:   Household Members/Support Persons (HM/SP):   Household Member/Support Person 1, Household Member/Support Person 2, Household Member/Support Person 3, Household Member/Support Person 4   HM/SP Name Relationship DOB or Age  HM/SP -1 [place] Shannon Mother    HM/SP -[place] Son 08/14/2018  HM/SP -3   Sister    HM/SP -4   Brother    HM/SP -5        HM/SP -6        HM/SP -7        HM/SP -8          Natural Supports (not living in the home):  Spouse/significant other   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)   Employment: Unemployed   Type of Work:     Education:  Programmer, systems   Homebound arranged:    Museum/gallery curator Resources:  Medicaid   Other Resources:  [facility]   Cultural/Religious Considerations Which May Impact Care: None identified  Strengths:  Ability to meet basic needs  , Home prepared for child  , Psychotropic Medications , Pediatrician chosen  Psychotropic Medications:  Prozac      Pediatrician:     [facility]  Pediatrician List:   [facility]  [place]      Pediatrician Fax Number:    Risk Factors/Current Problems:  Mental Health Concerns     Cognitive State:  Linear Thinking  , Able to Concentrate  , Alert  , Goal Oriented     Mood/Affect:   Calm  , Comfortable  , Relaxed  , Interested     CSW Assessment: CSW received consult for [place] Postnatal Depression Scale score of 12, history of anxiety, depression, possible schizophrenia and a history of AVH & SI/HI. CSW met with MOB to offer support and complete assessment. When CSW entered room, FOB was present. CSW introduced self and requested to speak with MOB alone. MOB provided verbal consent for CSW to complete consult with FOB present. CSW explained reasons for consult. MOB presented as calm, was agreeable to consult and remained engaged throughout encounter.   CSW reviewed MOB's [place] Postnatal Depression Scale score of 12 and inquired about MOB's mental health history. MOB reports she felt good emotionally during pregnancy, sharing that she just wants to continue to feel well postpartum. MOB confirms that she has a history of anxiety and depression. MOB states she was officially diagnosed with depression and anxiety May 2023 by her primary care provider when she began taking Prozac. However, MOB states that she has known that she has had symptoms of depression and anxiety from a young age. CSW inquired about possible schizophrenia diagnosis and history of auditory and visual hallucinations noted in MOB's chart. MOB states that a therapist she met with on two occasions (once in high  school and once May 2023) and her primary care provider both suspect that MOB may have a diagnosis of schizophrenia. CSW assessed further. MOB reports a history of mood swings, "making irrational decisions", and having a "temper issue" when getting mad at symptoms that make her believe that she has a diagnosis of schizophrenia. MOB states her father has a diagnosis of schizophrenia. CSW inquired if MOB has a history of auditory and/or visual hallucinations. MOB states that she has endorsed both visual hallucinations, marked by seeing a figure moving and auditory hallucinations, marked by hearing footsteps and  people talking when no one was there. MOB states she began endorsing AVH "in (her) dreams" when she was younger. MOB states that when she endorses AVH, it makes her feel on high alert but it does not cause significant distress. CSW inquired if MOB has endorsed command hallucinations in the past. MOB states that she has had auditory hallucinations like an "angel and demon weighing in on a situation." MOB denies a history of command hallucinations that have told her to harm herself or harm someone else. MOB denies endorsing AVH during her pregnancy with infant and denies a history of postpartum psychosis, stating that she believes that she experienced less AVH after the birth of her older son, Ceaser. MOB states that she was prescribed Prozac for a short period of time prior to pregnancy, from May 2023-June 2023 and reports the medication started to help improve her mood but states she stopped taking the medication prior to pregnancy. MOB states that she has restarted Prozac '20mg'$  daily to prevent symptoms of postpartum depression. MOB states that she did not experience postpartum depression after the birth of her son Ceaser. MOB states she began experiencing depressive symptoms after the first year her son was born, marked by feeling sad, decreased motivation, and loss of interest.   MOB states she is not current with a therapist. MOB was agreeable to both crisis and outpatient mental health resources. CSW reviewed psycho-education about postpartum psychosis symptoms and strongly encouraged MOB to utilize crisis resources in the case of a mental health emergency in the future. CSW inquired about a history of SI/HI. MOB denies endorsing thoughts of SI/HI since confirmed pregnancy or since giving birth. MOB identified her siblings and FOB as supports and identified her children as motivation to care for her mental health.  CSW provided education regarding the baby blues period vs. perinatal mood disorders, discussed  treatment and gave resources for mental health follow up if concerns arise.  CSW recommends self-evaluation during the postpartum time period using the New Mom Checklist from Postpartum Progress and encouraged MOB to contact a medical professional if symptoms are noted at any time.    MOB reports she has a car seat and bassinet but is in need of more wipes for infant. CSW provided information about [facility] home visiting program and informed MOB that wipes can be provided at visit. MOB expressed interest in a referral, CSW placed a referral to [facility] with MOB's verbal consent. CSW inquired about food resources, as food insecurity was noted in MOB's chart. MOB states she would like to apply for food stamps in person. CSW provided contact information for social services office and provided MOB with a food bank resource. MOB declined additional resource needs at this time.  MOB declined review of Sudden Infant Death Syndrome (SIDS) precautions, sharing she is aware of safe sleep practices.   CSW identifies no further need for intervention  and no barriers to discharge at this time.  CSW Plan/Description:  No Further Intervention Required/No Barriers to Discharge, Perinatal Mood and Anxiety Disorder (PMADs) Education, Other Information/Referral to [place], Gordon 05/16/2022, 4:44 PM

## 2022-05-16 NOTE — Anesthesia Postprocedure Evaluation (Signed)
Anesthesia Post Note  Patient: Teresa Blackwell  Procedure(s) Performed: AN AD [place]     Patient location during evaluation: Mother Baby Anesthesia Type: Epidural Level of consciousness: awake Pain management: satisfactory to patient Vital Signs Assessment: post-procedure vital signs reviewed and stable Respiratory status: spontaneous breathing Cardiovascular status: stable Anesthetic complications: no  No notable events documented.  Last Vitals:  Vitals:   05/15/22 2358 05/16/22 0355  BP: 127/61 114/60  Pulse: 74 79  Resp: 17 16  Temp: 36.7 C 36.9 C  SpO2: 98%     Last Pain:  Vitals:   05/16/22 0818  TempSrc:   PainSc: 0-No pain   Pain Goal: Patients Stated Pain Goal: 5 (05/15/22 0430)                 Casimer Lanius

## 2022-05-17 MED ORDER — IBUPROFEN 600 MG PO TABS: 600.0000 mg | ORAL_TABLET | Freq: Four times a day (QID) | ORAL | 0 refills | Status: DC

## 2022-05-17 MED ORDER — FERROUS SULFATE 325 (65 FE) MG PO TABS: 325.0000 mg | ORAL_TABLET | Freq: Every day | ORAL | 3 refills | Status: DC

## 2022-05-17 MED ORDER — FLUOXETINE HCL 20 MG PO CAPS: 20.0000 mg | ORAL_CAPSULE | Freq: Every day | ORAL | 3 refills | Status: DC

## 2022-05-17 MED ORDER — BENZOCAINE-MENTHOL 20-0.5 % EX AERO: 1.0000 | INHALATION_SPRAY | CUTANEOUS | 1 refills | Status: DC | PRN

## 2022-05-17 MED ORDER — ACETAMINOPHEN 325 MG PO TABS: 650.0000 mg | ORAL_TABLET | ORAL | 0 refills | Status: DC | PRN

## 2022-05-17 NOTE — Progress Notes (Signed)
MOB requests a stork pump. She does not know if she will be given a free pump through Medicaid. Currently, she is exclusively pumping and bottle feeding.

## 2022-05-17 NOTE — Lactation Note (Signed)
This note was copied from a baby's chart. Lactation Consultation Note  Patient Name: Teresa Blackwell [id number] Date: 05/17/2022 Age:21 hours Reason for consult: Follow-up assessment;Term Checked on mom to see how things were going. Noted BM given in bottles. Mom is exclusively pumping and bottle feeding. Praised mom for all the milk she is getting. Gave mom a sheet on bottle feeding and how much per hours of age. Asked mom how often was she pumping she stated every hour. Suggested not to pump every hour will cause over supply. Suggested every 3, every 2 1/2 at earliest. Mom is feeding baby often as well. Suggested every 3 hrs to get on a schedule. Explained baby should have large amounts every hour.  Mom thanks [facility] for information. Mom stated baby's tongue coated w/milk is that OK. LC thought it was to early to have thrush. If mom gets on a schedule baby's tongue should be cleared by next feeding. Inform Dr. In am if not. Discussed signs of yeast infection to mom. 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 Milk storage reviewed. Encouraged mom to asked questions as they arise or needs assistance.   Maternal Data    Feeding Nipple Type: Extra Slow Flow  LATCH Score       Type of Nipple: Everted at rest and after stimulation            Lactation Tools Discussed/Used Tools: Pump Breast pump type: Double-Electric Breast Pump Pump Education: Milk Storage;Setup, frequency, and cleaning Pumping frequency: q3hr  Interventions Interventions: DEBP  Discharge    Consult Status Consult Status: Follow-up Date: 05/17/22 Follow-up type: In-patient    Theodoro Kalata 05/17/2022, 1:43 AM

## 2022-05-17 NOTE — Lactation Note (Signed)
This note was copied from a baby's chart. Lactation Consultation Note  Patient Name: Teresa Blackwell [id number] Date: 05/17/2022 Age:21 hours  Reason for consult: Follow-up assessment;1st time breastfeeding;Primapara;Term;Exclusive pumping and bottle feeding  P2, 40.0 GA, 5% weight loss  Mother is pumping and feeding baby her breast milk with a bottle. Her volume is increasing. She was given a hand pump with instructions. She was not eligible to get get a stork pump. [facility] referral sent. SLP has consulted with mother/ infant and feeding plan was established using Dr. Saul Fordyce Preemie nipple.   Mom made aware of O/P services, breastfeeding support groups, community resources, and our phone # for post-discharge questions.     Maternal Data Has patient been taught Hand Expression?: Yes Does the patient have breastfeeding experience prior to this delivery?: Yes  Feeding Mother's Current Feeding Choice: Breast Milk   Lactation Tools Discussed/Used Tools: Pump;Bottle Breast pump type: Double-Electric Breast Pump;Manual Reason for Pumping: mother is pumping and bottle feeding Pumped volume: 25 mL   Discharge Discharge Education: Warning signs for feeding baby;Engorgement and breast care Pump: Manual WIC Program: Yes  Consult Status Consult Status: Complete Date: 05/17/22    Gwenevere Abbot 05/17/2022, 12:58 PM

## 2022-05-17 NOTE — Discharge Summary (Signed)
Postpartum Discharge Summary  Date of Service updated03/11/24     Patient Name: Teresa Blackwell DOB: 05-Jan-2001 MRN: [id number]  Date of admission: 05/15/2022 Delivery date:05/15/2022  Delivering provider: Bing Matter D  Date of discharge: 05/17/2022  Admitting diagnosis: Normal labor [O80, Z37.9] Intrauterine pregnancy: [redacted]w[redacted]d    Secondary diagnosis:  Principal Problem:   Normal labor Active Problems:   Anemia of pregnancy in third trimester   Chronic depressive disorder   Vitamin D deficiency  Additional problems: none    Discharge diagnosis: Term Pregnancy Delivered                                              Post partum procedures: na Augmentation: Pitocin Complications: None  Hospital course: Onset of Labor With Vaginal Delivery      22y.o. yo [id number] admitted in Latent Labor on 05/15/2022. Labor course was complicated bynothing  Membrane Rupture Time/Date: 2:45 AM ,05/15/2022   Delivery Method:Vaginal, Spontaneous  Episiotomy: None  Lacerations:  None  Patient had a postpartum course complicated by nothing.  She is ambulating, tolerating a regular diet, passing flatus, and urinating well. Patient is discharged home in stable condition on 05/17/22.  Newborn Data: Birth date:05/15/2022  Birth time:2:35 PM  Gender:Female  Living status:Living  Apgars:8 ,9  Weight:3620 g   Magnesium Sulfate received: No BMZ received: No Rhophylac:No MMR:No T-DaP:Given postpartum Flu: No Transfusion:No  Physical exam  Vitals:   05/16/22 0355 05/16/22 1330 05/16/22 2300 05/17/22 0514  BP: 114/60 121/60 133/64 120/75  Pulse: 79 70 77 63  Resp: '16 18 18   '$ Temp: 98.4 F (36.9 C) 98 F (36.7 C) 98.2 F (36.8 C) 98.2 F (36.8 C)  TempSrc: Axillary Oral Oral   SpO2:  99% 98% 100%  Weight:      Height:       General: alert and cooperative Lochia: appropriate Uterine Fundus: firm Incision: N/A DVT Evaluation: Negative Homan's sign. Labs: Lab Results   Component Value Date   WBC 15.0 (H) 05/16/2022   HGB 8.7 (L) 05/16/2022   HCT 26.2 (L) 05/16/2022   MCV 94.2 05/16/2022   PLT 240 05/16/2022      Latest Ref Rng & Units 08/26/2019    6:38 PM  CMP  Glucose 70 - 99 mg/dL 113   BUN 6 - 20 mg/dL 7   Creatinine 0.44 - 1.00 mg/dL 0.80   Sodium 135 - 145 mmol/L 133   Potassium 3.5 - 5.1 mmol/L 3.6   Chloride 98 - 111 mmol/L 100   CO2 22 - 32 mmol/L 25   Calcium 8.9 - 10.3 mg/dL 9.0   Total Protein 6.5 - 8.1 g/dL 7.5   Total Bilirubin 0.3 - 1.2 mg/dL 1.0   Alkaline Phos 38 - 126 U/L 55   AST 15 - 41 U/L 16   ALT 0 - 44 U/L 14    Edinburgh Score:    05/15/2022    4:55 PM  Edinburgh Postnatal Depression Scale Screening Tool  I have been able to laugh and see the funny side of things. 0  I have looked forward with enjoyment to things. 0  I have blamed myself unnecessarily when things went wrong. 1  I have been anxious or worried for no good reason. 2  I have felt scared or panicky for no good reason.  2  Things have been getting on top of me. 2  I have been so unhappy that I have had difficulty sleeping. 2  I have felt sad or miserable. 1  I have been so unhappy that I have been crying. 2  The thought of harming myself has occurred to me. 0  Edinburgh Postnatal Depression Scale Total 12      After visit meds:  Allergies as of 05/17/2022       Reactions   Latex         Medication List     STOP taking these medications    ferrous sulfate 325 (65 FE) MG EC tablet Replaced by: ferrous sulfate 325 (65 FE) MG tablet       TAKE these medications    acetaminophen 325 MG tablet Commonly known as: Tylenol Take 2 tablets (650 mg total) by mouth every 4 (four) hours as needed (for pain scale < 4). What changed:  medication strength how much to take when to take this reasons to take this   BENADRYL PO Take by mouth.   benzocaine-Menthol 20-0.5 % Aero Commonly known as: DERMOPLAST Apply 1 Application topically as  needed for irritation (perineal discomfort).   cyclobenzaprine 10 MG tablet Commonly known as: FLEXERIL Take 1 tablet (10 mg total) by mouth 2 (two) times daily as needed for muscle spasms.   ferrous sulfate 325 (65 FE) MG tablet Take 1 tablet (325 mg total) by mouth daily with breakfast. Start taking on: May 18, 2022 Replaces: ferrous sulfate 325 (65 FE) MG EC tablet   FLUoxetine 20 MG capsule Commonly known as: PROZAC Take 1 capsule (20 mg total) by mouth daily. Start taking on: May 18, 2022   ibuprofen 600 MG tablet Commonly known as: ADVIL Take 1 tablet (600 mg total) by mouth every 6 (six) hours.   prenatal multivitamin Tabs tablet Take 1 tablet by mouth daily at 12 noon.         Discharge home in stable condition Infant Feeding: Bottle and Breast Infant Disposition:home with mother Discharge instruction: per After Visit Summary and Postpartum booklet. Activity: Advance as tolerated. Pelvic rest for 6 weeks.  Diet: routine diet Anticipated Birth Control: Nexplanon Postpartum Appointment:6 weeks Additional Postpartum F/U:  6 weeks Future Appointments:No future appointments. Follow up Visit:  [facility]. Schedule an appointment as soon as possible for a visit in 6 week(s).   Specialty: Obstetrics and Gynecology Contact information: [address] [place] [phone]                    05/17/2022 Betsy Coder, MD

## 2022-05-26 ENCOUNTER — Telehealth (HOSPITAL_COMMUNITY): Payer: Self-pay | Admitting: *Deleted

## 2022-05-26 NOTE — Telephone Encounter (Signed)
Attempted hospital discharge follow-up call. Left message for patient to return RN call with any questions or concerns. Erline Levine, RN, 05/26/22, [phone]

## 2022-06-05 IMAGING — CT CT MAXILLOFACIAL W/O CM
3 of 6 series · 16 of 47 positions shown, 19 images · non-contrast
Comparison: None.

CLINICAL DATA: Facial trauma.

EXAM:
CT MAXILLOFACIAL WITHOUT CONTRAST
TECHNIQUE: Multidetector CT imaging of the maxillofacial structures was
performed. Multiplanar CT image reconstructions were also generated.

[Series 2: maxilllofacial 2.0 hr40 3 · axial · 0.29mm/px · z∈[+1324,+1444]mm · 11 of 72 slices shown, 14 images]
[im 6/72  brain]
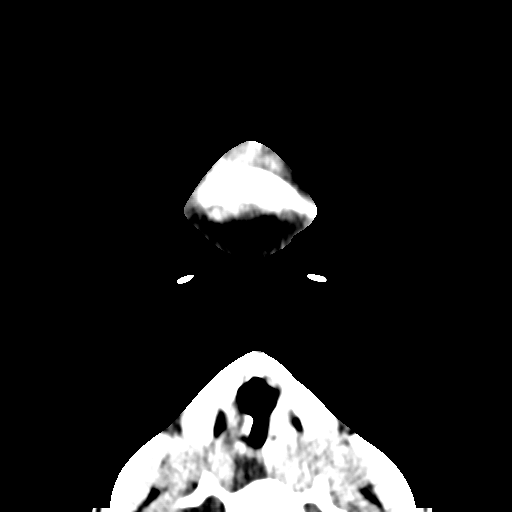
[im 6/72  bone]
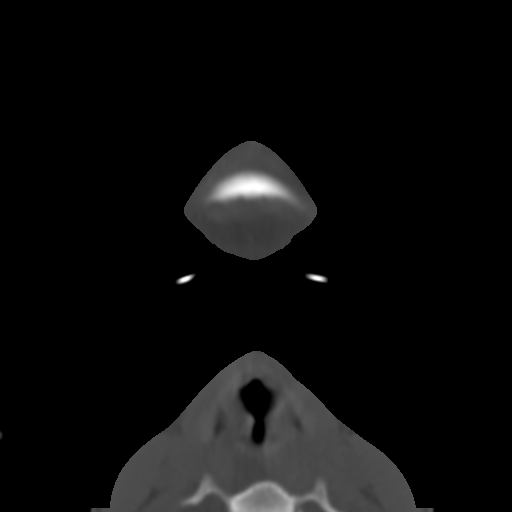
[im 11/72  bone]
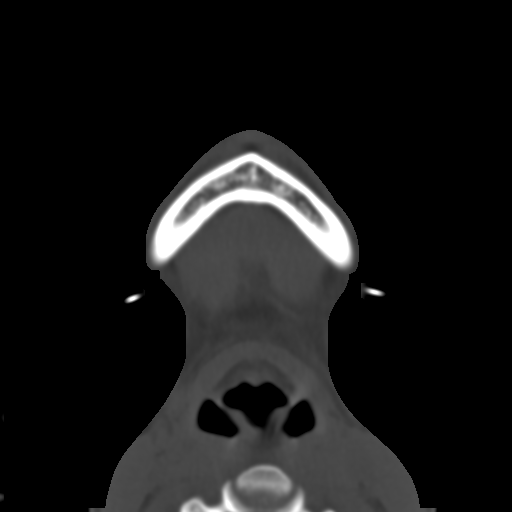
[im 16/72  bone]
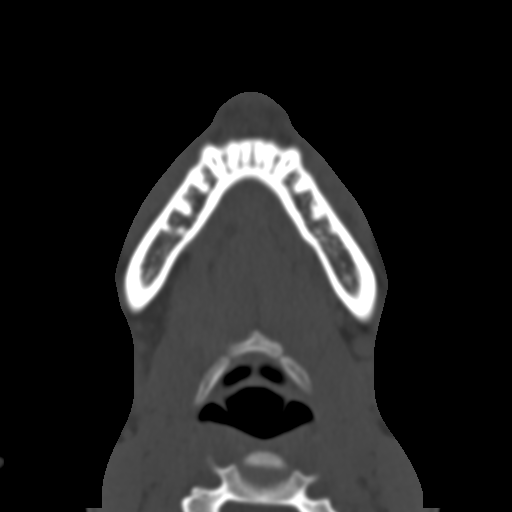
[im 26/72  bone]
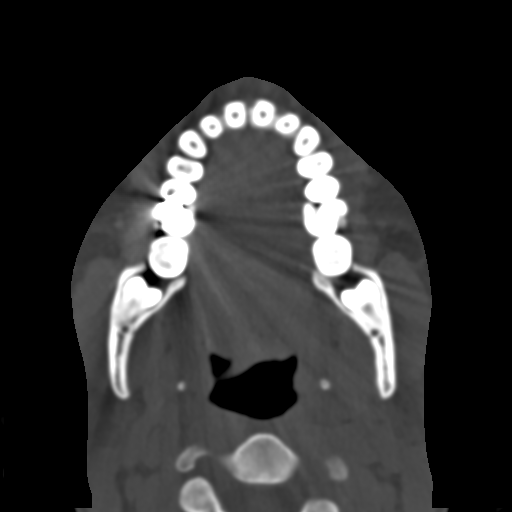
[im 31/72  brain]
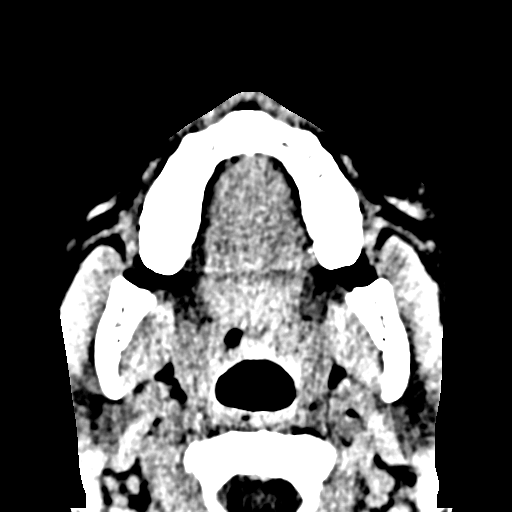
[im 31/72  bone]
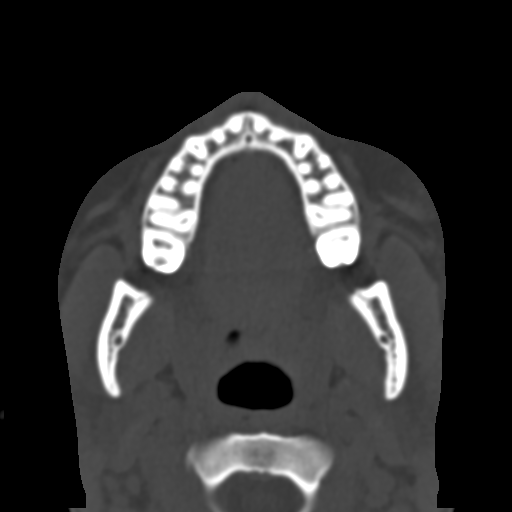
[im 36/72  bone]
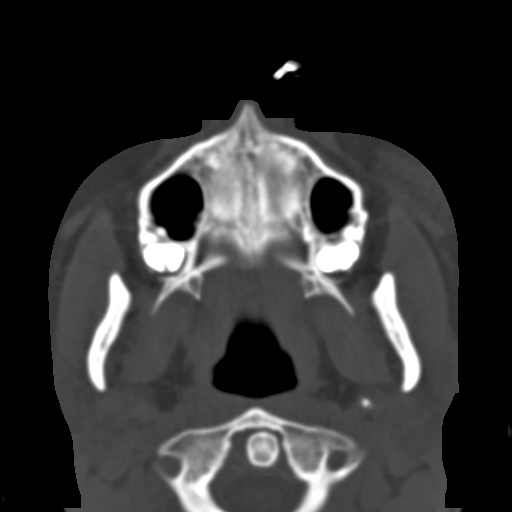
[im 41/72  bone]
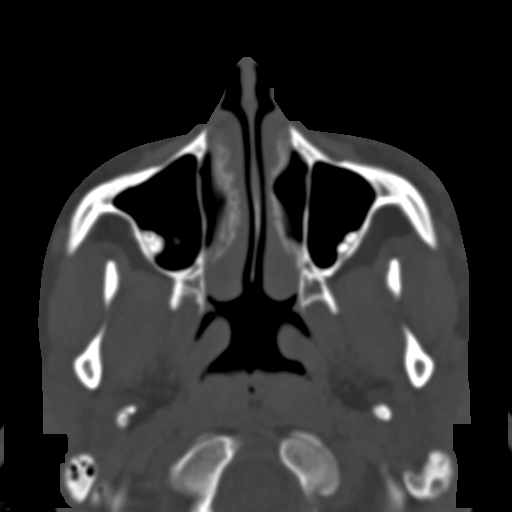
[im 46/72  bone]
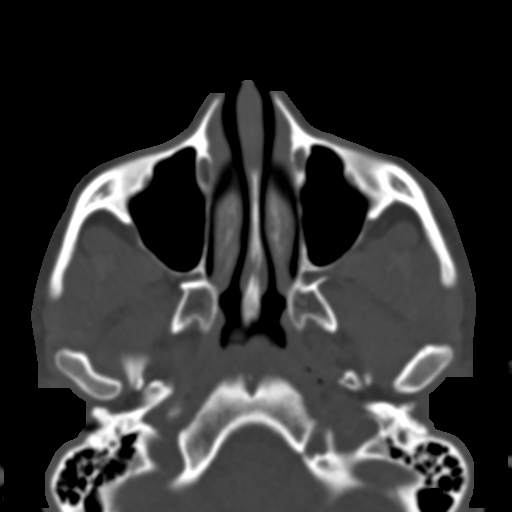
[im 56/72  brain]
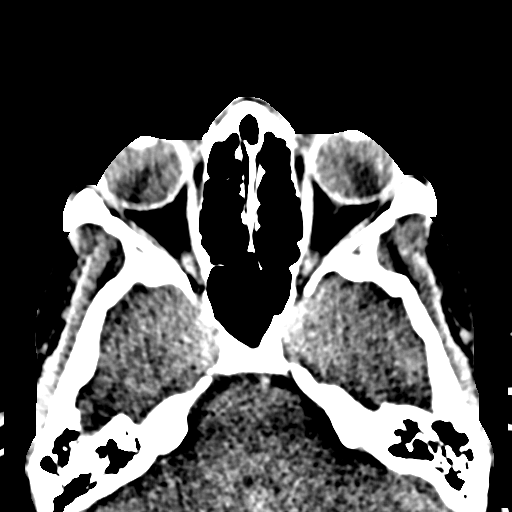
[im 56/72  bone]
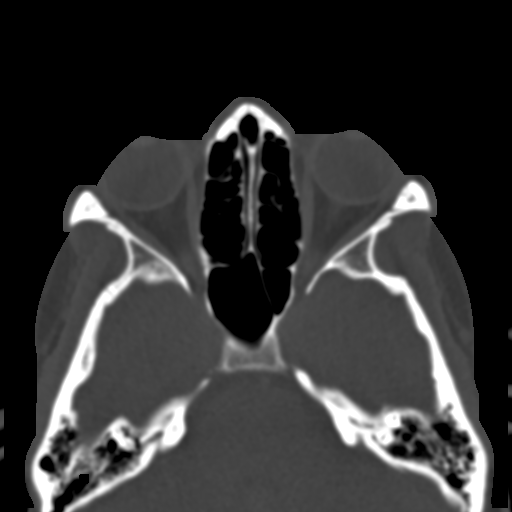
[im 61/72  bone]
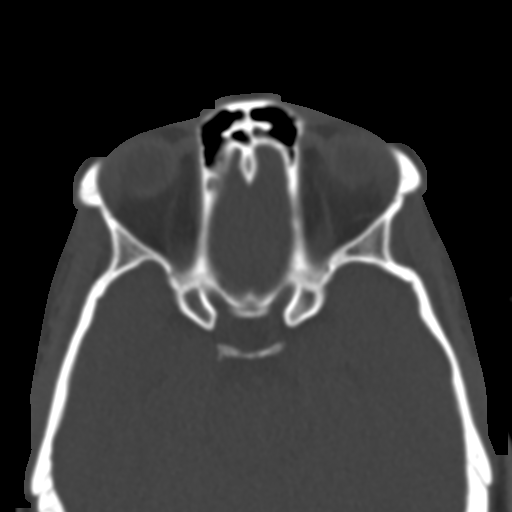
[im 66/72  bone]
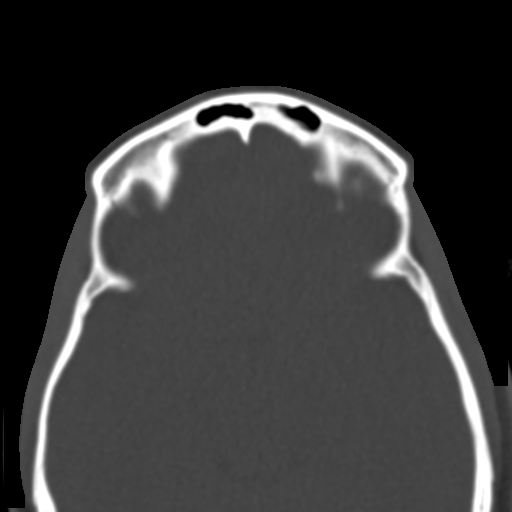

[Series 6: st cor · coronal · 0.28mm/px · 3 of 74 slices shown]
[im 19/74  bone]
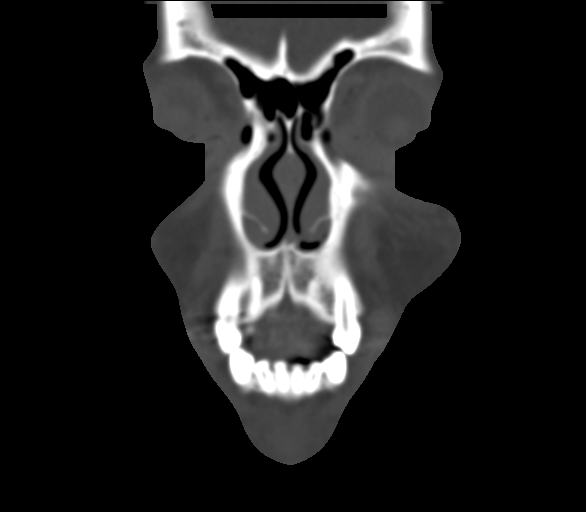
[im 37/74  bone]
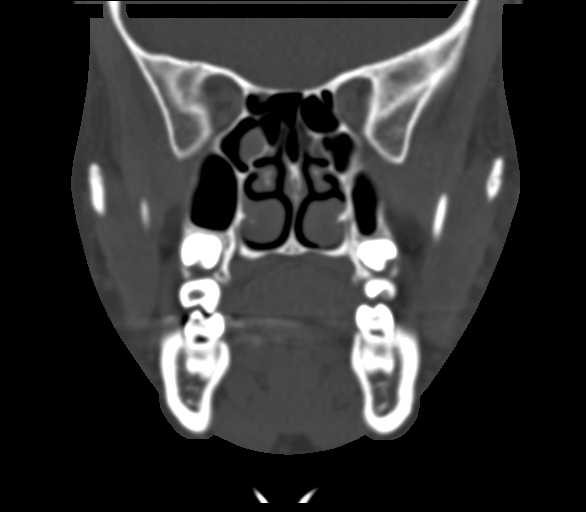
[im 55/74  bone]
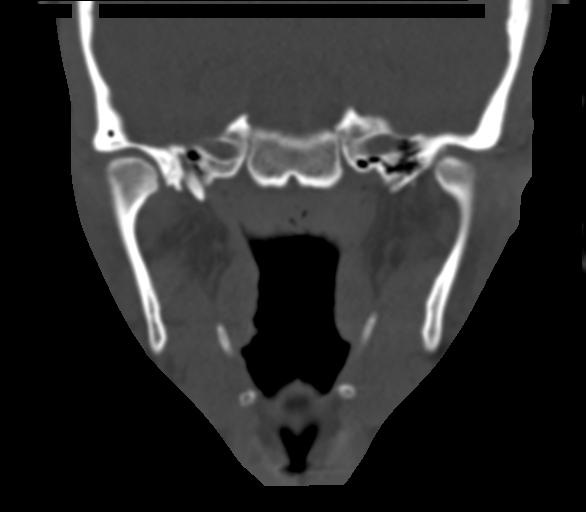

[Series 9: bone sag · sagittal · 0.28mm/px · 2 of 83 slices shown]
[im 28/83  bone]
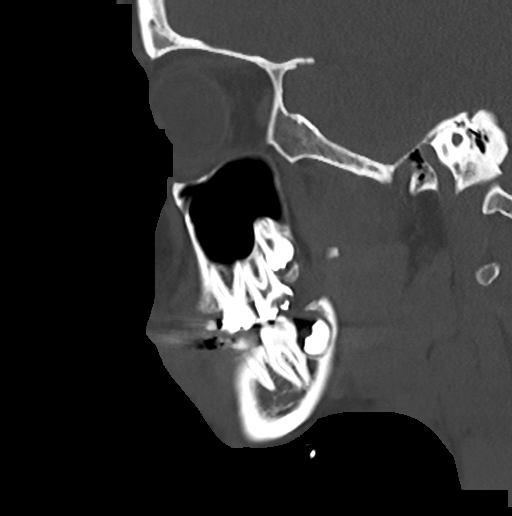
[im 55/83  bone]
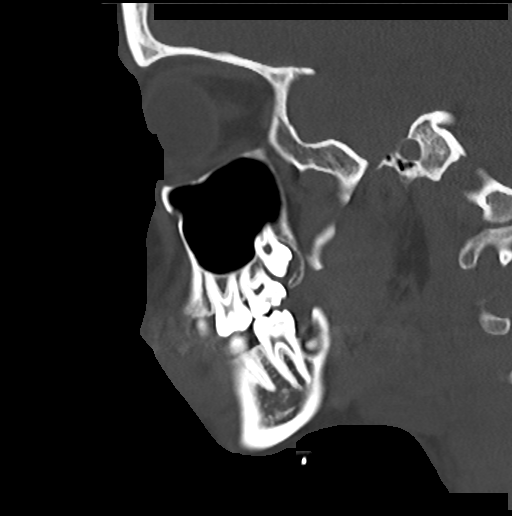

[16 of 47 positions shown; findings below may reference images not displayed]

FINDINGS: Osseous: No fracture or mandibular dislocation. No destructive
process.

Orbits: Negative. No traumatic or inflammatory finding.

Sinuses: Mild mucoperiosteal thickening of paranasal sinuses no
air-fluid level. The mastoid air cells are clear.

Soft tissues: Mild contusion of the left facial soft tissues. No
hematoma or fluid collection.

Limited intracranial: No significant or unexpected finding.
IMPRESSION: 1. No acute facial bone fractures.
2. Mild contusion of the left facial soft tissues.

## 2022-12-01 ENCOUNTER — Encounter (HOSPITAL_COMMUNITY): Payer: Self-pay | Admitting: *Deleted

## 2022-12-01 ENCOUNTER — Ambulatory Visit (HOSPITAL_COMMUNITY)
Admission: EM | Admit: 2022-12-01 | Discharge: 2022-12-01 | Disposition: A | Discharge status: Home / Self Care | Payer: Medicaid Other | Attending: Emergency Medicine | Admitting: Emergency Medicine

## 2022-12-01 DIAGNOSIS — N644 Mastodynia: Secondary | ICD-10-CM

## 2022-12-01 NOTE — ED Provider Notes (Signed)
[facility]    CSN: [id number] Arrival date & time: 12/01/22  1233      History   Chief Complaint Chief Complaint  Patient presents with   Breast Mass    HPI Teresa Blackwell is a 22 y.o. female.   Patient presents to clinic over concerns of a painful area in her right breast that she noticed 2 days prior.  The area is small and pearl shaped, deep within her breast.  Denies any fevers, drainage or streaking.  Reports she had a clogged milk duct with her prior child it feels similar, she reports she is pregnant and is unsure about how far along she has.  She has not been established with OB/GYN.  Her mother was diagnosed with breast cancer in her 30s and the patient is concerned. She has been taking NSAIDs for pain.   She denies any abdominal pain, cramping, or vaginal bleeding.  Reports she has had constipation and morning sickness.  The history is provided by the patient and medical records.    Past Medical History:  Diagnosis Date   Anemia    Anxiety    Depression    Medical history non-contributory    Schizophrenia [facility])     Patient Active Problem List   Diagnosis Date Noted   Normal labor 05/15/2022   Anemia of pregnancy in third trimester 05/15/2022   Chronic depressive disorder 05/15/2022   Vitamin D deficiency 05/15/2022    Past Surgical History:  Procedure Laterality Date   Nexplanon insertion  11/21/2017   NO PAST SURGERIES      OB History     Gravida  3   Para  2   Term  2   Preterm  0   AB  0   Living  2      SAB  0   IAB  0   Ectopic  0   Multiple  0   Live Births  2            Home Medications    Prior to Admission medications   Medication Sig Start Date End Date Taking? Authorizing Provider  FLUoxetine (PROZAC) 20 MG capsule Take 1 capsule (20 mg total) by mouth daily. 05/18/22   Jaymes Graff, MD  Prenatal Vit-Fe Fumarate-FA (PRENATAL MULTIVITAMIN) TABS tablet Take 1 tablet by mouth daily at 12 noon.     [provider]    Family History Family History  Problem Relation Age of Onset   Breast cancer Mother    Schizophrenia Father    Diabetes Maternal Grandmother    Hypertension Maternal Grandmother    Cancer Maternal Grandmother    HIV/AIDS Maternal Grandfather    Hypertension Paternal Grandfather     Social History Social History   Tobacco Use   Smoking status: Never   Smokeless tobacco: Current   Tobacco comments:    vape  Vaping Use   Vaping status: Never Used  Substance Use Topics   Alcohol use: Not Currently   Drug use: No     Allergies   Latex   Review of Systems Review of Systems  Constitutional:  Negative for fever.  Gastrointestinal:  Positive for constipation and nausea. Negative for abdominal pain, diarrhea and vomiting.  Skin:  Negative for color change and wound.     Physical Exam Triage Vital Signs ED Triage Vitals  Encounter Vitals Group     BP 12/01/22 1339 110/75     Systolic BP Percentile --  Diastolic BP Percentile --      Pulse Rate 12/01/22 1339 75     Resp 12/01/22 1339 18     Temp 12/01/22 1339 98.8 F (37.1 C)     Temp src --      SpO2 12/01/22 1339 97 %     Weight --      Height --      Head Circumference --      Peak Flow --      Pain Score 12/01/22 1336 5     Pain Loc --      Pain Education --      Exclude from Growth Chart --    No data found.  Updated Vital Signs BP 110/75   Pulse 75   Temp 98.8 F (37.1 C)   Resp 18   LMP 10/17/2022 Comment: no due date yet  SpO2 97%   Visual Acuity Right Eye Distance:   Left Eye Distance:   Bilateral Distance:    Right Eye Near:   Left Eye Near:    Bilateral Near:     Physical Exam Vitals and nursing note reviewed.  Constitutional:      Appearance: Normal appearance.  HENT:     Head: Normocephalic and atraumatic.     Right Ear: External ear normal.     Left Ear: External ear normal.     Nose: Nose normal.     Mouth/Throat:     Mouth: Mucous  membranes are moist.  Eyes:     General: No scleral icterus. Cardiovascular:     Rate and Rhythm: Normal rate.  Pulmonary:     Effort: Pulmonary effort is normal. No respiratory distress.  Chest:  Breasts:    Right: Mass present. No swelling, bleeding, inverted nipple, nipple discharge, skin change or tenderness.       Comments: Small non-tender round, mobile area to 5 o clock position of breast, close to areola. No skin changes or nipple inversions.  Musculoskeletal:        General: Normal range of motion.  Skin:    General: Skin is warm and dry.  Neurological:     General: No focal deficit present.     Mental Status: She is alert and oriented to person, place, and time.  Psychiatric:        Mood and Affect: Mood normal.        Behavior: Behavior normal. Behavior is cooperative.      UC Treatments / Results  Labs (all labs ordered are listed, but only abnormal results are displayed) Labs Reviewed - No data to display  EKG   Radiology No results found.  Procedures Procedures (including critical care time)  Medications Ordered in UC Medications - No data to display  Initial Impression / Assessment and Plan / UC Course  I have reviewed the triage vital signs and the nursing notes.  Pertinent labs & imaging results that were available during my care of the patient were reviewed by me and considered in my medical decision making (see chart for details).  Vitals and triage reviewed, patient is hemodynamically stable.  She does have a round, mobile, nontender abnormality near her areola at the 5 o'clock position of her right breast.  She is pregnant, breast changes could be related to hormonal changes in pregnancy.  Will obtain ultrasound of right breast due to family history of breast cancer.  Advised Tylenol for pain control.  No skin changes, discharge, erythema or streaking, low concern for  abscess or infection.  Advised to follow-up with OB/GYN regarding pregnancy  status.  Plan of care, follow-up care and return precautions given, no questions at this time.     Final Clinical Impressions(s) / UC Diagnoses   Final diagnoses:  Breast pain, right     Discharge Instructions      I have sent an order for an ultrasound of your right breast.  If you do not hear from the breast center soon, you can reach out to them and schedule.  It is important to follow-up with an OB/GYN regarding your pregnancy, I suggest taking a daily prenatal vitamin, many over-the-counter options are sufficient..  Do not take any more NSAIDs, you can take Tylenol 500 mg every 6 hours as needed for pain and inflammation.   Return to clinic for any new or urgent symptoms.      ED Prescriptions   None    PDMP not reviewed this encounter.   [employer], [place], [place] 12/01/22 1416

## 2022-12-01 NOTE — Discharge Instructions (Addendum)
I have sent an order for an ultrasound of your right breast.  If you do not hear from the breast center soon, you can reach out to them and schedule.  It is important to follow-up with an OB/GYN regarding your pregnancy, I suggest taking a daily prenatal vitamin, many over-the-counter options are sufficient..  Do not take any more NSAIDs, you can take Tylenol 500 mg every 6 hours as needed for pain and inflammation.   Return to clinic for any new or urgent symptoms.

## 2022-12-01 NOTE — ED Triage Notes (Signed)
Pt first noticed a Rt breast mass 2 days ago. Pt's Mother had breast Cancer and Pt wants to be checked out. Pt also reports she is pregnant Pt reports to have pain at the site of mass.

## 2023-01-12 LAB — OB RESULTS CONSOLE HEPATITIS B SURFACE ANTIGEN: Hepatitis B Surface Ag: NEGATIVE

## 2023-01-12 LAB — OB RESULTS CONSOLE RUBELLA ANTIBODY, IGM: Rubella: IMMUNE

## 2023-01-12 LAB — HEPATITIS C ANTIBODY: HCV Ab: NEGATIVE

## 2023-01-12 LAB — OB RESULTS CONSOLE RPR: RPR: NONREACTIVE

## 2023-01-12 LAB — OB RESULTS CONSOLE GC/CHLAMYDIA
Chlamydia: NEGATIVE
Neisseria Gonorrhea: NEGATIVE

## 2023-03-09 NOTE — L&D Delivery Note (Signed)
 OB/GYN Faculty Practice Delivery Note  Teresa Blackwell is a 23 y.o. [id number] s/p NSVD at [redacted]w[redacted]d. She was admitted for SOL.   ROM: 0h 70m with clear fluid GBS Status:   Maximum Maternal Temperature: Temp (24hrs), Avg:98.2 F (36.8 C), Min:98 F (36.7 C), Max:98.3 F (36.8 C)   Labor Progress: Initial SVE: 8/100/-1. SROM occurred and rapidly progressed to complete. Precipitously delivered.  Delivery Date/Time: 07/19/23 0916 Delivery: Called to room and patient was complete and involuntarily pushing. Head delivered OA to LOA. No nuchal cord present. Shoulder and body delivered in usual fashion. Infant with spontaneous cry, placed on mother's abdomen, dried and stimulated. Cord clamped x 2 after +1-minute delay, and cut by FOB. Cord gases not obtained. Cord blood drawn. IV access lost during delivery. IM Pitocin  given. Dr. Mills Alma came into room and relinquished care. Baby Weight: pending  Infant: APGAR (1 MIN): 8  APGAR (5 MINS): 9  APGAR (10 MINS):    Maud Sorenson, MD [facility] Fellow, [employer] for [facility], [facility] 07/19/2023, 9:36 AM

## 2023-07-19 ENCOUNTER — Encounter (HOSPITAL_COMMUNITY): Payer: Self-pay | Admitting: *Deleted

## 2023-07-19 ENCOUNTER — Other Ambulatory Visit: Payer: Self-pay

## 2023-07-19 ENCOUNTER — Inpatient Hospital Stay (HOSPITAL_COMMUNITY)
Admission: AD | Admit: 2023-07-19 | Discharge: 2023-07-21 | DRG: 807 | Disposition: A | Discharge status: Home / Self Care | Payer: MEDICAID | Attending: Obstetrics and Gynecology | Admitting: Obstetrics and Gynecology

## 2023-07-19 DIAGNOSIS — O26893 Other specified pregnancy related conditions, third trimester: Secondary | ICD-10-CM | POA: Diagnosis present

## 2023-07-19 DIAGNOSIS — Z833 Family history of diabetes mellitus: Secondary | ICD-10-CM

## 2023-07-19 DIAGNOSIS — Z91040 Latex allergy status: Secondary | ICD-10-CM | POA: Diagnosis not present

## 2023-07-19 DIAGNOSIS — Z818 Family history of other mental and behavioral disorders: Secondary | ICD-10-CM | POA: Diagnosis not present

## 2023-07-19 DIAGNOSIS — D509 Iron deficiency anemia, unspecified: Secondary | ICD-10-CM | POA: Diagnosis present

## 2023-07-19 DIAGNOSIS — Z8249 Family history of ischemic heart disease and other diseases of the circulatory system: Secondary | ICD-10-CM

## 2023-07-19 DIAGNOSIS — O99344 Other mental disorders complicating childbirth: Secondary | ICD-10-CM | POA: Diagnosis present

## 2023-07-19 DIAGNOSIS — O9902 Anemia complicating childbirth: Secondary | ICD-10-CM | POA: Diagnosis present

## 2023-07-19 DIAGNOSIS — Z30017 Encounter for initial prescription of implantable subdermal contraceptive: Secondary | ICD-10-CM | POA: Diagnosis not present

## 2023-07-19 DIAGNOSIS — Z3A39 39 weeks gestation of pregnancy: Secondary | ICD-10-CM | POA: Diagnosis not present

## 2023-07-19 DIAGNOSIS — Z23 Encounter for immunization: Secondary | ICD-10-CM

## 2023-07-19 DIAGNOSIS — F39 Unspecified mood [affective] disorder: Secondary | ICD-10-CM | POA: Diagnosis present

## 2023-07-19 LAB — CBC
HCT: 33.4 % — ABNORMAL LOW (ref 36.0–46.0)
Hemoglobin: 10.7 g/dL — ABNORMAL LOW (ref 12.0–15.0)
MCH: 30.3 pg (ref 26.0–34.0)
MCHC: 32 g/dL (ref 30.0–36.0)
MCV: 94.6 fL (ref 80.0–100.0)
Platelets: 392 10*3/uL (ref 150–400)
RBC: 3.53 MIL/uL — ABNORMAL LOW (ref 3.87–5.11)
RDW: 13.4 % (ref 11.5–15.5)
WBC: 9.1 10*3/uL (ref 4.0–10.5)
nRBC: 0 % (ref 0.0–0.2)

## 2023-07-19 LAB — TYPE AND SCREEN
ABO/RH(D): A POS
Antibody Screen: NEGATIVE

## 2023-07-19 LAB — RPR: RPR Ser Ql: NONREACTIVE

## 2023-07-19 LAB — POCT FERN TEST: POCT Fern Test: POSITIVE

## 2023-07-19 MED ORDER — LIDOCAINE HCL (PF) 1 % IJ SOLN
30.0000 mL | INTRAMUSCULAR | Status: DC | PRN
  Filled 2023-07-19: qty 30

## 2023-07-19 MED ORDER — OXYTOCIN-SODIUM CHLORIDE 30-0.9 UT/500ML-% IV SOLN: 2.5000 [IU]/h | INTRAVENOUS | Status: DC

## 2023-07-19 MED ORDER — ACETAMINOPHEN 325 MG PO TABS: 650.0000 mg | ORAL_TABLET | ORAL | Status: DC | PRN

## 2023-07-19 MED ORDER — LACTATED RINGERS IV SOLN: 500.0000 mL | Freq: Once | INTRAVENOUS | Status: DC

## 2023-07-19 MED ORDER — OXYCODONE-ACETAMINOPHEN 5-325 MG PO TABS: 1.0000 | ORAL_TABLET | ORAL | Status: DC | PRN

## 2023-07-19 MED ORDER — PRENATAL MULTIVITAMIN CH
1.0000 | ORAL_TABLET | Freq: Every day | ORAL | Status: DC
  Administered 2023-07-20 – 2023-07-21 (×2): 1 via ORAL
  Filled 2023-07-19 (×2): qty 1

## 2023-07-19 MED ORDER — ONDANSETRON HCL 4 MG PO TABS: 4.0000 mg | ORAL_TABLET | ORAL | Status: DC | PRN

## 2023-07-19 MED ORDER — METHYLERGONOVINE MALEATE 0.2 MG PO TABS
0.2000 mg | ORAL_TABLET | ORAL | Status: DC | PRN
Start: 2023-07-19 — End: 2023-07-21

## 2023-07-19 MED ORDER — FENTANYL-BUPIVACAINE-NACL 0.5-0.125-0.9 MG/250ML-% EP SOLN
12.0000 mL/h | EPIDURAL | Status: DC | PRN
  Filled 2023-07-19: qty 250

## 2023-07-19 MED ORDER — LACTATED RINGERS IV SOLN: 500.0000 mL | INTRAVENOUS | Status: DC | PRN

## 2023-07-19 MED ORDER — OXYTOCIN BOLUS FROM INFUSION: 333.0000 mL | Freq: Once | INTRAVENOUS | Status: DC

## 2023-07-19 MED ORDER — ZOLPIDEM TARTRATE 5 MG PO TABS: 5.0000 mg | ORAL_TABLET | Freq: Every evening | ORAL | Status: DC | PRN

## 2023-07-19 MED ORDER — LACTATED RINGERS IV SOLN: INTRAVENOUS | Status: DC

## 2023-07-19 MED ORDER — BENZOCAINE-MENTHOL 20-0.5 % EX AERO: 1.0000 | INHALATION_SPRAY | CUTANEOUS | Status: DC | PRN

## 2023-07-19 MED ORDER — COCONUT OIL OIL
1.0000 | TOPICAL_OIL | Status: DC | PRN
  Administered 2023-07-21: 1 via TOPICAL

## 2023-07-19 MED ORDER — FLEET ENEMA RE ENEM: 1.0000 | ENEMA | RECTAL | Status: DC | PRN

## 2023-07-19 MED ORDER — FLUOXETINE HCL 20 MG PO CAPS
20.0000 mg | ORAL_CAPSULE | Freq: Every day | ORAL | Status: DC
  Administered 2023-07-19 – 2023-07-21 (×3): 20 mg via ORAL
  Filled 2023-07-19 (×3): qty 1

## 2023-07-19 MED ORDER — MEASLES, MUMPS & RUBELLA VAC IJ SOLR: 0.5000 mL | Freq: Once | INTRAMUSCULAR | Status: DC

## 2023-07-19 MED ORDER — PHENYLEPHRINE 80 MCG/ML (10ML) SYRINGE FOR IV PUSH (FOR BLOOD PRESSURE SUPPORT): 80.0000 ug | PREFILLED_SYRINGE | INTRAVENOUS | Status: DC | PRN

## 2023-07-19 MED ORDER — DIPHENHYDRAMINE HCL 25 MG PO CAPS: 25.0000 mg | ORAL_CAPSULE | Freq: Four times a day (QID) | ORAL | Status: DC | PRN

## 2023-07-19 MED ORDER — DIPHENHYDRAMINE HCL 50 MG/ML IJ SOLN: 12.5000 mg | INTRAMUSCULAR | Status: DC | PRN

## 2023-07-19 MED ORDER — TETANUS-DIPHTH-ACELL PERTUSSIS 5-2.5-18.5 LF-MCG/0.5 IM SUSY
0.5000 mL | PREFILLED_SYRINGE | Freq: Once | INTRAMUSCULAR | Status: AC
  Administered 2023-07-21: 0.5 mL via INTRAMUSCULAR
  Filled 2023-07-19: qty 0.5

## 2023-07-19 MED ORDER — METHYLERGONOVINE MALEATE 0.2 MG/ML IJ SOLN
0.2000 mg | INTRAMUSCULAR | Status: DC | PRN
  Administered 2023-07-19: 0.2 mg via INTRAMUSCULAR
  Filled 2023-07-19: qty 1

## 2023-07-19 MED ORDER — EPHEDRINE 5 MG/ML INJ: 10.0000 mg | INTRAVENOUS | Status: DC | PRN

## 2023-07-19 MED ORDER — OXYTOCIN 10 UNIT/ML IJ SOLN
INTRAMUSCULAR | Status: AC
  Administered 2023-07-19: 10 [IU]
  Filled 2023-07-19: qty 1

## 2023-07-19 MED ORDER — ONDANSETRON HCL 4 MG/2ML IJ SOLN: 4.0000 mg | INTRAMUSCULAR | Status: DC | PRN

## 2023-07-19 MED ORDER — OXYCODONE-ACETAMINOPHEN 5-325 MG PO TABS
2.0000 | ORAL_TABLET | ORAL | Status: DC | PRN
  Administered 2023-07-19: 2 via ORAL
  Filled 2023-07-19: qty 2

## 2023-07-19 MED ORDER — WITCH HAZEL-GLYCERIN EX PADS: 1.0000 | MEDICATED_PAD | CUTANEOUS | Status: DC | PRN

## 2023-07-19 MED ORDER — METHYLERGONOVINE MALEATE 0.2 MG/ML IJ SOLN: 0.2000 mg | INTRAMUSCULAR | Status: DC | PRN

## 2023-07-19 MED ORDER — METHYLERGONOVINE MALEATE 0.2 MG PO TABS: 0.2000 mg | ORAL_TABLET | ORAL | Status: DC | PRN

## 2023-07-19 MED ORDER — SIMETHICONE 80 MG PO CHEW: 80.0000 mg | CHEWABLE_TABLET | ORAL | Status: DC | PRN

## 2023-07-19 MED ORDER — IBUPROFEN 600 MG PO TABS
600.0000 mg | ORAL_TABLET | Freq: Four times a day (QID) | ORAL | Status: DC
  Administered 2023-07-19 – 2023-07-21 (×8): 600 mg via ORAL
  Filled 2023-07-19 (×8): qty 1

## 2023-07-19 MED ORDER — DIBUCAINE (PERIANAL) 1 % EX OINT: 1.0000 | TOPICAL_OINTMENT | CUTANEOUS | Status: DC | PRN

## 2023-07-19 MED ORDER — SOD CITRATE-CITRIC ACID 500-334 MG/5ML PO SOLN: 30.0000 mL | ORAL | Status: DC | PRN

## 2023-07-19 MED ORDER — ONDANSETRON HCL 4 MG/2ML IJ SOLN: 4.0000 mg | Freq: Four times a day (QID) | INTRAMUSCULAR | Status: DC | PRN

## 2023-07-19 MED ORDER — SENNOSIDES-DOCUSATE SODIUM 8.6-50 MG PO TABS
2.0000 | ORAL_TABLET | Freq: Every day | ORAL | Status: DC
  Administered 2023-07-20 – 2023-07-21 (×2): 2 via ORAL
  Filled 2023-07-19 (×2): qty 2

## 2023-07-19 NOTE — MAU Note (Signed)
 Teresa Blackwell is a 23 y.o. at [redacted]w[redacted]d here in MAU reporting: started contracting last night. When she woke up at 0500, she was leaking,.... "I am leaking right now."/ 2cm when last checked.  Uncomplicated preg.  2 prior term, vag deliveries.  No bleeding.   Onset of complaint: 0500 Pain score: 20 Vitals:   07/19/23 0818  BP: 127/82  Pulse: 72  Resp: (!) 22  Temp: 98.3 F (36.8 C)  SpO2: 100%     WUJ:WJXBJ on, reports +FM Lab orders placed from triage:

## 2023-07-19 NOTE — MAU Note (Signed)
 Patient presented to MAU very uncomfortable, refusing to sit in bed. SVE 8/100/-1. Heart tones obtained at 150. MD notified. Transferred to L&D via stretcher.

## 2023-07-19 NOTE — H&P (Signed)
 Teresa Blackwell is a 23 y.o. female presenting for LABOR.  She presented at 8 cm and wanted an epidural but could not get one.  . OB History     Gravida  3   Para  3   Term  3   Preterm  0   AB  0   Living  3      SAB  0   IAB  0   Ectopic  0   Multiple  0   Live Births  3          Past Medical History:  Diagnosis Date   Anemia    Anxiety    Depression    Medical history non-contributory    Schizophrenia (HCC)    Past Surgical History:  Procedure Laterality Date   Nexplanon  insertion  11/21/2017   NO PAST SURGERIES     Family History: family history includes Breast cancer in her mother; Cancer in her maternal grandmother; Diabetes in her maternal grandmother; HIV/AIDS in her maternal grandfather; Hypertension in her maternal grandmother and paternal grandfather; Schizophrenia in her father. Social History:  reports that she has never smoked. She uses smokeless tobacco. She reports that she does not currently use alcohol. She reports that she does not use drugs.     Maternal Diabetes: No Genetic Screening: Normal Maternal Ultrasounds/Referrals: Normal Fetal Ultrasounds or other Referrals:  None Maternal Substance Abuse:  No Significant Maternal Medications:  None Significant Maternal Lab Results:  Group B Strep negative Number of Prenatal Visits:greater than 3 verified prenatal visits Maternal Vaccinations: Other Comments:  None  Review of Systems History Dilation: 10 Effacement (%): 100 Exam by:: chelsea evans, RN Blood pressure (!) 118/54, pulse 73, temperature 98.1 F (36.7 C), temperature source Oral, resp. rate 16, height 5\' 2"  (1.575 m), weight 64.8 kg, last menstrual period 10/17/2022, SpO2 100%, unknown if currently breastfeeding. Exam Physical Exam  Prenatal labs: ABO, Rh: --/--/A POS (05/13 0840) Antibody: NEG (05/13 0840) Rubella: Immune (11/06 0000) RPR: NON REACTIVE (05/13 0844)  HBsAg: Negative (11/06 0000)  HIV:   neg GBS:   neg   Assessment/Plan: Term active labor Pt delivered precipitously Placenta delivered intact  No lacerations  Pt sent to pp for recovery   Krishang Reading A Abyan Cadman 07/19/2023, 5:48 PM

## 2023-07-19 NOTE — Lactation Note (Signed)
 This note was copied from a baby's chart. Lactation Consultation Note  Patient Name: Teresa Blackwell [id number] Date: 07/19/2023 Age:22 hours Reason for consult: Initial assessment;Term Mom smiling in a happy mood. Mom happy to tell LC about her BF experiences. Praised mom. Mom stated she has started leaking some. Mom states the baby BF well after delivery but has been spitty w/fluid and isn't hungry right now.  Mom is trying every 3 hrs and if cueing. Mom encouraged to feed baby 8-12 times/24 hours and with feeding cues.  Newborn feeding habits, behavior, STS, I&O reviewed. Encouraged mom to call for assistance as needed or questions.  Maternal Data Does the patient have breastfeeding experience prior to this delivery?: Yes How long did the patient breastfeed?: 1st child now 79 yrs old for 9 months, her 2nd child now 1 yrs ol for 6 months  Feeding    LATCH Score       Type of Nipple: Everted at rest and after stimulation            Lactation Tools Discussed/Used    Interventions Interventions: Breast feeding basics reviewed;Education;LC Services brochure  Discharge    Consult Status Consult Status: Follow-up Date: 07/20/23 Follow-up type: In-patient    Teresa Blackwell 07/19/2023, 9:01 PM

## 2023-07-20 LAB — CBC
HCT: 26.3 % — ABNORMAL LOW (ref 36.0–46.0)
Hemoglobin: 8.7 g/dL — ABNORMAL LOW (ref 12.0–15.0)
MCH: 30.2 pg (ref 26.0–34.0)
MCHC: 33.1 g/dL (ref 30.0–36.0)
MCV: 91.3 fL (ref 80.0–100.0)
Platelets: 330 10*3/uL (ref 150–400)
RBC: 2.88 MIL/uL — ABNORMAL LOW (ref 3.87–5.11)
RDW: 13.3 % (ref 11.5–15.5)
WBC: 10.1 10*3/uL (ref 4.0–10.5)
nRBC: 0 % (ref 0.0–0.2)

## 2023-07-20 NOTE — Progress Notes (Signed)
 Post Partum Day 1 Subjective: no complaints, up ad lib, voiding, tolerating PO, + flatus, and +BM  Objective: Blood pressure 119/61, pulse (!) 58, temperature 98.2 F (36.8 C), temperature source Oral, resp. rate 16, height 5\' 2"  (1.575 m), weight 64.8 kg, last menstrual period 10/17/2022, SpO2 100%, unknown if currently breastfeeding.  Physical Exam:  General: alert, cooperative, and no distress Lochia: appropriate Uterine Fundus: FF, NT Incision: n/a DVT Evaluation: no calf tenderness  Recent Labs    07/19/23 0844 07/20/23 0545  HGB 10.7* 8.7*  HCT 33.4* 26.3*    Assessment/Plan: Plan for discharge tomorrow, Breastfeeding, Lactation consult, and Contraception plans to use nexplanon  Cont routine PP care   LOS: 1 day   Madelene Schanz, MD 07/20/2023, 10:47 AM

## 2023-07-20 NOTE — Social Work (Signed)
CSW acknowledged consult and completed a clinical assessment.  There are no barriers to d/c.  Clinical assessment notes will be entered at a later time.  Tymar Polyak, LCSWA Clinical Social Worker [phone]  

## 2023-07-20 NOTE — Lactation Note (Signed)
 This note was copied from a baby's chart. Lactation Consultation Note  Patient Name: Teresa Blackwell Date: 07/20/2023 Age:22 hours Reason for consult: Follow-up assessment  P3, Baby has been spitty.  Mother attempted to latch. Feed on demand with cues.  Goal 8-12+ times per day after first 24 hrs.  Place baby STS if not cueing.  Provided hand pump and suggest calling for latch assistance as needed.   Feeding Mother's Current Feeding Choice: Breast Milk and Formula  LATCH Score Latch: Grasps breast easily, tongue down, lips flanged, rhythmical sucking.  Audible Swallowing: A few with stimulation  Type of Nipple: Everted at rest and after stimulation  Comfort (Breast/Nipple): Soft / non-tender  Hold (Positioning): Assistance needed to correctly position infant at breast and maintain latch.  LATCH Score: 8  Interventions Interventions: Education  Discharge Pump: Personal;DEBP;Manual Blas Buoy)  Consult Status Consult Status: Follow-up Date: 07/21/23 Follow-up type: In-patient   Luellen Sages  RN, IBCLC 07/20/2023, 12:46 PM

## 2023-07-21 ENCOUNTER — Encounter (HOSPITAL_COMMUNITY): Payer: Self-pay | Admitting: Obstetrics and Gynecology

## 2023-07-21 DIAGNOSIS — D509 Iron deficiency anemia, unspecified: Secondary | ICD-10-CM | POA: Diagnosis present

## 2023-07-21 DIAGNOSIS — F39 Unspecified mood [affective] disorder: Secondary | ICD-10-CM | POA: Diagnosis present

## 2023-07-21 LAB — SURGICAL PATHOLOGY

## 2023-07-21 MED ORDER — POLYSACCHARIDE IRON COMPLEX 150 MG PO CAPS
150.0000 mg | ORAL_CAPSULE | Freq: Every day | ORAL | Status: DC
  Administered 2023-07-21: 150 mg via ORAL
  Filled 2023-07-21: qty 1

## 2023-07-21 MED ORDER — IBUPROFEN 600 MG PO TABS: 600.0000 mg | ORAL_TABLET | Freq: Four times a day (QID) | ORAL | 0 refills | Status: AC

## 2023-07-21 MED ORDER — POLYSACCHARIDE IRON COMPLEX 150 MG PO CAPS: 150.0000 mg | ORAL_CAPSULE | Freq: Every day | ORAL | 3 refills | Status: AC

## 2023-07-21 NOTE — Progress Notes (Signed)
Discharge instructions reviewed with patient and SO. Pt verbalized understanding. Infant placed in car seat per parent. ID bands matched, umbilical clamp and security tag removed. Patient and infant discharged home in stable condition with all personal belongings. Pt requested to walk out to car.

## 2023-07-21 NOTE — Discharge Summary (Signed)
 SVD OB Discharge Summary       Patient Name: Teresa Blackwell DOB: 10/02/2000 MRN: [id number]  Date of admission: 07/19/2023 Delivering MD: Maud Sorenson Date of delivery: 07/19/2023 Type of delivery: SVD  Newborn Data: Sex: Baby female Live born female  Birth Weight: 7 lb 4.1 oz (3290 g) APGAR: 8, 9  Newborn Delivery   Birth date/time: 07/19/2023 09:16:00 Delivery type: Vaginal, Spontaneous     Feeding: breast and bottle Infant being discharge to home with mother in stable condition.   Admitting diagnosis: Normal labor [O80, Z37.9] SVD (spontaneous vaginal delivery) [O80] Intrauterine pregnancy: [redacted]w[redacted]d     Secondary diagnosis:  Principal Problem:   Normal labor Active Problems:   SVD (spontaneous vaginal delivery)   Postpartum normal course   Unspecified mood (affective) disorder (HCC)   IDA (iron deficiency anemia)                                Complications: None                                                              Intrapartum Procedures: spontaneous vaginal delivery Postpartum Procedures: none Complications-Operative and Postpartum: none Augmentation: N/A   History of Present Illness: [name] is a 23 y.o. female, G3P3003, who presents at [redacted]w[redacted]d weeks gestation. The patient has been followed at  [facility]  Her pregnancy has been complicated by:  Patient Active Problem List   Diagnosis Date Noted   Postpartum normal course 07/21/2023   Unspecified mood (affective) disorder (HCC) 07/21/2023   IDA (iron deficiency anemia) 07/21/2023   SVD (spontaneous vaginal delivery) 07/19/2023   Normal labor 05/15/2022   Anemia of pregnancy in third trimester 05/15/2022   Chronic depressive disorder 05/15/2022   Vitamin D deficiency 05/15/2022     Active Ambulatory Problems    Diagnosis Date Noted   Normal labor 05/15/2022   Anemia of pregnancy in third trimester 05/15/2022   Chronic depressive disorder  05/15/2022   Vitamin D deficiency 05/15/2022   Resolved Ambulatory Problems    Diagnosis Date Noted   Encounter for induction of labor 08/13/2018   Postpartum care following vaginal delivery 6/8 08/14/2018   SVD (spontaneous vaginal delivery) 08/14/2018   Acute blood loss anemia 08/16/2018   Past Medical History:  Diagnosis Date   Anemia    Anxiety    Depression    Medical history non-contributory    Schizophrenia [facility])      Hospital course:  Onset of Labor With Vaginal Delivery      23 y.o. yo G3P3003 at [redacted]w[redacted]d was admitted in Active Labor on 07/19/2023. Labor course was complicated bypt was admitted on 5/13 for acitve labor and then proceeded to having a precipitous delivery in [facility]U, had svd on 5/13 over intact pernium, ebl was , hgb drop of 10.7-8.7, had a baby female. Membrane Rupture Time/Date: 9:07 AM,07/19/2023  Delivery Method:Vaginal, Spontaneous Operative Delivery:N/A Episiotomy: None Lacerations:  None Patient had a postpartum course complicated by None.  She is ambulating, tolerating a regular diet, passing flatus, and urinating well. Patient is discharged home in stable condition on 07/21/23.  Newborn Data: Birth date:07/19/2023 Birth time:9:16 AM Gender:Female Living status:Living Apgars:8 ,9  Weight:3290 g Postpartum Day # 2 :  Patient up ad lib, denies syncope or dizziness. Reports consuming regular diet without issues and denies N/V. Patient reports 0 bowel movement + passing flatus.  Denies issues with urination and reports bleeding is "lighter."  Patient is BR/BT feeding and reports going well.  Desires nexplanon  for postpartum contraception.  Pain is being appropriately managed with use of po meds. 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 Pt feels no one relates to her but pt still encouraged, when pt does make eye contact she has a flat stare, but when interacting and looking away she shows facial smiles and appropriate interactions, pt cvalm and appears happy, recommended medication but pt declined she is aware of how to reach out and will have a 2 week PPV with [facility] for mood check.  Mother does have h/o bipolar and if there is a family history of schizophrenia then psych could look into schizoaffective disorder.   Physical exam  Vitals:   07/20/23 0558 07/20/23 1456 07/20/23 2030 07/21/23 0606  BP: 119/61 110/67 121/62 (!) 101/52  Pulse: (!) 58 61 65 62  Resp: 16 16 16 20   Temp:  98.2 F (36.8 C) 98.4 F (36.9 C) 97.9 F (36.6 C)  TempSrc:  Oral Oral Oral  SpO2: 100% 99%  100%  Weight:      Height:       General: alert, cooperative, and no distress Lochia: appropriate Uterine Fundus: firm Perineum: intact DVT Evaluation: No evidence of DVT seen on physical exam. Negative Homan's sign. No cords or calf tenderness. No significant calf/ankle edema.  Labs: Lab Results  Component Value Date   WBC 10.1 07/20/2023   HGB 8.7 (L) 07/20/2023   HCT 26.3 (L) 07/20/2023   MCV 91.3 07/20/2023   PLT 330 07/20/2023      Latest Ref Rng & Units 08/26/2019    6:38 PM  CMP  Glucose 70 - 99 mg/dL 528   BUN 6 - 20 mg/dL 7   Creatinine 4.13 - 2.44 mg/dL 0.10   Sodium 272 - 536 mmol/L 133   Potassium 3.5 - 5.1 mmol/L 3.6   Chloride 98 - 111 mmol/L 100   CO2 22 - 32 mmol/L 25   Calcium 8.9 - 10.3 mg/dL 9.0   Total Protein 6.5 - 8.1 g/dL 7.5   Total Bilirubin 0.3 - 1.2 mg/dL 1.0   Alkaline Phos 38 - 126 U/L 55   AST 15 - 41 U/L 16   ALT 0 - 44 U/L 14     Date of discharge: 07/21/2023 Discharge Diagnoses: Term Pregnancy-delivered Discharge instruction: per  After Visit Summary and "Baby and Me Booklet".  After visit meds:   Activity:           unrestricted and pelvic rest Advance as tolerated. Pelvic rest for 6 weeks.  Diet:                routine Medications: PNV, Ibuprofen , Colace, and Iron Postpartum contraception: Nexplanon  Condition:  Pt discharge to home with baby in stable  Meds: Allergies as of 07/21/2023  Reactions   Latex         Medication List     STOP taking these medications    FLUoxetine  20 MG capsule Commonly known as: PROZAC        TAKE these medications    ibuprofen  600 MG tablet Commonly known as: ADVIL  Take 1 tablet (600 mg total) by mouth every 6 (six) hours.   iron polysaccharides 150 MG capsule Commonly known as: NIFEREX Take 1 capsule (150 mg total) by mouth daily.   prenatal multivitamin Tabs tablet Take 1 tablet by mouth daily at 12 noon.        Discharge Follow Up:   Follow-up Information     [facility]. Schedule an appointment as soon as possible for a visit in 2 week(s).   Specialty: Obstetrics and Gynecology Why: 2 week Mood checlk and 6 weeks PPV. Contact information: [address] [place]  [phone]                 Philemon Riedesel  CNM, FNP-C, PMHNP-BC  [address]  [place], [place] [address]  Cell: [phone]  Office Phone: [phone] Fax: [phone] 07/21/2023  10:31 AM

## 2023-07-21 NOTE — Clinical Social Work Maternal (Signed)
 CLINICAL SOCIAL WORK MATERNAL/CHILD NOTE  Patient Details  Name: Teresa Blackwell MRN: [id number] Date of Birth: [birth year]  Date:  07/21/2023  Clinical Social Worker Initiating Note:  Adelina Adu Flora Parks Date/Time: Initiated:  07/20/23/1425     Child's Name:  Teresa Blackwell   Biological Parents:  Mother, Father Esmay Tenbrink 2002/01/15, Kathy Parker 11/29/2000)   Need for Interpreter:  None   Reason for Referral:  Behavioral Health Concerns   Address:  [address] [place] [address]    Phone number:  [phone] (home)     Additional phone number:   Household Members/Support Persons (HM/SP):   Household Member/Support Person 1, Household Member/Support Person 2, Household Member/Support Person 3   HM/SP Name Relationship DOB or Age  HM/SP -1 [place] Shannon mother    HM/SP -2 Dequantre Blackwell FOB 11/29/2000  HM/SP -3 Dequantre Azell Leopard son 05/15/2022  HM/SP -4        HM/SP -5        HM/SP -6        HM/SP -7        HM/SP -8          Natural Supports (not living in the home):      Professional Supports: None   Employment: Unemployed   Type of Work:     Education:  Engineer, agricultural   Homebound arranged:    Surveyor, quantity Resources:  Medicaid   Other Resources:  [facility]   Cultural/Religious Considerations Which May Impact Care:    Strengths:  Home prepared for child  , Ability to meet basic needs  , Pediatrician chosen   Psychotropic Medications:         Pediatrician:    [employer] operational [employer] area  Pediatrician List:   [facility]  [place]    [place]    [facility]      Pediatrician Fax Number:    Risk Factors/Current Problems:  None   Cognitive State:  Able to Concentrate  , Alert     Mood/Affect:  Comfortable  , Calm     CSW Assessment: CSW received a consult for MOB hx of anxiety, depression and schizophrenia as well as housing insecurity. CSW met with MOB to complete  assessment and offer support. CSW entered the room and observed MOB resting in bed the infant in the bassinet and FOB at bedside. CSW introduced self, and requested to speak to MOB alone, MOB reported she would like FOB to stay and agreeable to discussing anything with him present. CSW inquired about how MOB was feeling, MOB reported good. CSW inquired about MOB current address and phone number, MOB verified the information on file was correct.   CW inquired about MOB MH hx, MOB reported she was diagnosed when her first child was 1. MOB reported she experienced PPD with both of her previous children. MOB reported she was experienced anger outburst and it ws hard to get out of bed for about a month after she delivered. CSW inquired about recent auditor or visual hallucination MOB reported she has not experienced AVH in over a year. MOB reported her mood as stable. MOB reports she does talked to herself as a coping technique but in a positive way to remain calm. MOB reported she also takes Prozac , journals and talks to her significant other  which has been beneficial for her MH. MOB reported she is interested in seeing a therapist and requested resources, CSW provided therapy  resources. CSW provided education regarding the baby blues period vs. perinatal mood disorders, discussed treatment and gave resources for mental health follow up if concerns arise.  CSW recommends self-evaluation during the postpartum time period using the New Mom Checklist from Postpartum Progress and encouraged MOB to contact a medical professional if symptoms are noted at any time. MOB identified FOB as her primary support.  CSW inquired about MOB current housing status, MOB reported she and FOB are living with FOB, CSW inquired about MOB's older children, MOB reported her oldest child is living with her aunt and her youngest is with her in the home. CSW inquired about CPS involvement, MOB denied any CPS involvement.  CSW provided review  of Sudden Infant Death Syndrome (SIDS) precautions. MOB reported she has all necessary items for the infant including a bassinet and car seat.  CSW identifies no further need for intervention and no barriers to discharge at this time.   CSW Plan/Description:  No Further Intervention Required/No Barriers to Discharge, Sudden Infant Death Syndrome (SIDS) Education, Perinatal Mood and Anxiety Disorder (PMADs) Education    Dyke Glasser, LCSW 07/21/2023, 2:30 PM

## 2023-07-21 NOTE — Lactation Note (Signed)
 This note was copied from a baby's chart. Lactation Consultation Note  Patient Name: Teresa Blackwell [id number] Date: 07/21/2023 Age:22 hours Reason for consult: Follow-up assessment  P3, Mother has baby latched upon entering. Rotated baby tummy to tummy.   Mother's nipples are tender.  Provided mother with coconut oil.   Mother pumped 35 ml x 2 last night with her manual pump and it is in the refrigerator. Mother states she has a history of oversupply. Discussed allowing the baby to feed on demand 8-12 times per day and only pumping or hand expressing after feedings after ice to relieve to discomfort.  Pump a few minutes if needed but pumping after every feeding or when you are leaking will increase your supply. Reviewed engorgement care and monitoring voids/stools.  Maternal Data Has patient been taught Hand Expression?: Yes  Feeding Mother's Current Feeding Choice: Breast Milk and Formula  LATCH Score Latch: Grasps breast easily, tongue down, lips flanged, rhythmical sucking. (Baby when [facility] entered room)  Audible Swallowing: A few with stimulation  Type of Nipple: Everted at rest and after stimulation  Comfort (Breast/Nipple): Filling, red/small blisters or bruises, mild/mod discomfort  Hold (Positioning): No assistance needed to correctly position infant at breast.  LATCH Score: 8   Lactation Tools Discussed/Used Tools: Pump Breast pump type: Manual Pump Education: Milk Storage Reason for Pumping: Mother's request Pumping frequency: PRn Pumped volume: 35 mL  Interventions Interventions: Hand pump;Education  Discharge Discharge Education: Engorgement and breast care;Warning signs for feeding baby Pump: Personal;DEBP  Consult Status Consult Status: Complete Date: 07/21/23  Luellen Sages  RN, IBCLC 07/21/2023, 9:19 AM

## 2023-08-02 ENCOUNTER — Telehealth (HOSPITAL_COMMUNITY): Payer: Self-pay | Admitting: *Deleted

## 2023-08-02 NOTE — Telephone Encounter (Signed)
 Attempted hospital discharge follow-up call. Left message for patient to return RN call with any questions or concerns. Julien Odor, RN, 08/02/23, [phone]
